# Patient Record
Sex: Female | Born: 1970
Health system: Southern US, Community
[De-identification: ages and names within clinical notes are randomized; demographics above are authoritative.]

## PROBLEM LIST (undated history)

## (undated) DIAGNOSIS — I10 Essential (primary) hypertension: Secondary | ICD-10-CM

## (undated) DIAGNOSIS — K645 Perianal venous thrombosis: Secondary | ICD-10-CM

## (undated) DIAGNOSIS — K219 Gastro-esophageal reflux disease without esophagitis: Secondary | ICD-10-CM

## (undated) DIAGNOSIS — N809 Endometriosis, unspecified: Secondary | ICD-10-CM

## (undated) DIAGNOSIS — K2980 Duodenitis without bleeding: Secondary | ICD-10-CM

## (undated) HISTORY — DX: Endometriosis, unspecified: N80.9

## (undated) HISTORY — DX: Duodenitis without bleeding: K29.80

## (undated) HISTORY — PX: OTHER SURGICAL HISTORY: SHX169

## (undated) HISTORY — DX: Perianal venous thrombosis: K64.5

## (undated) HISTORY — DX: Essential (primary) hypertension: I10

## (undated) HISTORY — PX: ABDOMINAL HYSTERECTOMY: SHX81

---

## 1997-09-04 ENCOUNTER — Inpatient Hospital Stay (HOSPITAL_COMMUNITY): Admission: AD | Admit: 1997-09-04 | Discharge: 1997-09-04 | Payer: Self-pay | Admitting: Obstetrics and Gynecology

## 1997-09-05 ENCOUNTER — Inpatient Hospital Stay (HOSPITAL_COMMUNITY): Admission: AD | Admit: 1997-09-05 | Discharge: 1997-09-05 | Payer: Self-pay | Admitting: Obstetrics and Gynecology

## 1997-10-07 ENCOUNTER — Ambulatory Visit (HOSPITAL_COMMUNITY): Admission: RE | Admit: 1997-10-07 | Discharge: 1997-10-07 | Payer: Self-pay | Admitting: Obstetrics and Gynecology

## 1997-11-01 ENCOUNTER — Inpatient Hospital Stay (HOSPITAL_COMMUNITY): Admission: AD | Admit: 1997-11-01 | Discharge: 1997-11-04 | Payer: Self-pay | Admitting: Obstetrics and Gynecology

## 1997-11-05 ENCOUNTER — Encounter (HOSPITAL_COMMUNITY): Admission: RE | Admit: 1997-11-05 | Discharge: 1997-12-13 | Payer: Self-pay | Admitting: Obstetrics & Gynecology

## 1997-12-09 ENCOUNTER — Other Ambulatory Visit: Admission: RE | Admit: 1997-12-09 | Discharge: 1997-12-09 | Payer: Self-pay | Admitting: Obstetrics & Gynecology

## 1997-12-20 ENCOUNTER — Inpatient Hospital Stay (HOSPITAL_COMMUNITY): Admission: EM | Admit: 1997-12-20 | Discharge: 1997-12-20 | Payer: Self-pay | Admitting: Obstetrics and Gynecology

## 1997-12-20 ENCOUNTER — Ambulatory Visit (HOSPITAL_COMMUNITY): Admission: RE | Admit: 1997-12-20 | Discharge: 1997-12-20 | Payer: Self-pay

## 1997-12-20 ENCOUNTER — Encounter: Payer: Self-pay | Admitting: Anesthesiology

## 1999-04-01 ENCOUNTER — Other Ambulatory Visit: Admission: RE | Admit: 1999-04-01 | Discharge: 1999-04-01 | Payer: Self-pay | Admitting: Obstetrics and Gynecology

## 2000-01-05 DIAGNOSIS — N809 Endometriosis, unspecified: Secondary | ICD-10-CM

## 2000-01-05 HISTORY — PX: OVARY SURGERY: SHX727

## 2000-01-05 HISTORY — DX: Endometriosis, unspecified: N80.9

## 2000-09-12 ENCOUNTER — Other Ambulatory Visit: Admission: RE | Admit: 2000-09-12 | Discharge: 2000-09-12 | Payer: Self-pay | Admitting: Obstetrics and Gynecology

## 2004-05-05 ENCOUNTER — Ambulatory Visit: Payer: Self-pay | Admitting: Obstetrics and Gynecology

## 2005-01-12 ENCOUNTER — Observation Stay: Payer: Self-pay | Admitting: Certified Nurse Midwife

## 2006-12-09 ENCOUNTER — Ambulatory Visit: Payer: Self-pay | Admitting: Family Medicine

## 2007-01-05 DIAGNOSIS — K645 Perianal venous thrombosis: Secondary | ICD-10-CM

## 2007-01-05 HISTORY — PX: CHOLECYSTECTOMY: SHX55

## 2007-01-05 HISTORY — DX: Perianal venous thrombosis: K64.5

## 2007-02-03 ENCOUNTER — Other Ambulatory Visit: Payer: Self-pay

## 2007-02-04 ENCOUNTER — Observation Stay: Payer: Self-pay | Admitting: General Surgery

## 2007-11-19 ENCOUNTER — Ambulatory Visit: Payer: Self-pay | Admitting: Emergency Medicine

## 2008-02-14 ENCOUNTER — Ambulatory Visit: Payer: Self-pay | Admitting: Gastroenterology

## 2008-02-23 ENCOUNTER — Ambulatory Visit: Payer: Self-pay | Admitting: Gastroenterology

## 2009-09-07 ENCOUNTER — Observation Stay: Payer: Self-pay

## 2010-02-26 ENCOUNTER — Ambulatory Visit: Payer: Self-pay

## 2010-08-18 ENCOUNTER — Encounter: Payer: Self-pay | Admitting: Rheumatology

## 2010-09-05 ENCOUNTER — Encounter: Payer: Self-pay | Admitting: Rheumatology

## 2010-10-05 ENCOUNTER — Encounter: Payer: Self-pay | Admitting: Rheumatology

## 2011-03-18 ENCOUNTER — Ambulatory Visit: Payer: Self-pay | Admitting: Family Medicine

## 2011-09-09 ENCOUNTER — Other Ambulatory Visit: Payer: Self-pay | Admitting: General Surgery

## 2011-09-09 LAB — CBC WITH DIFFERENTIAL/PLATELET
Basophil #: 0 10*3/uL (ref 0.0–0.1)
Basophil %: 0.5 %
Eosinophil #: 0.8 10*3/uL — ABNORMAL HIGH (ref 0.0–0.7)
Eosinophil %: 10.3 %
HCT: 41.9 % (ref 35.0–47.0)
HGB: 14.2 g/dL (ref 12.0–16.0)
Lymphocyte #: 1.8 10*3/uL (ref 1.0–3.6)
Lymphocyte %: 22.6 %
MCH: 29.5 pg (ref 26.0–34.0)
MCHC: 33.8 g/dL (ref 32.0–36.0)
MCV: 87 fL (ref 80–100)
Monocyte #: 0.5 x10 3/mm (ref 0.2–0.9)
Monocyte %: 6.2 %
Neutrophil #: 4.9 10*3/uL (ref 1.4–6.5)
Neutrophil %: 60.4 %
Platelet: 336 10*3/uL (ref 150–440)
RBC: 4.82 10*6/uL (ref 3.80–5.20)
RDW: 13.3 % (ref 11.5–14.5)
WBC: 8.1 10*3/uL (ref 3.6–11.0)

## 2011-09-09 LAB — SEDIMENTATION RATE: Erythrocyte Sed Rate: 13 mm/hr (ref 0–20)

## 2011-09-10 ENCOUNTER — Ambulatory Visit: Payer: Self-pay | Admitting: General Surgery

## 2011-11-03 ENCOUNTER — Ambulatory Visit: Payer: Self-pay | Admitting: General Surgery

## 2011-11-05 LAB — PATHOLOGY REPORT

## 2011-12-09 ENCOUNTER — Ambulatory Visit: Payer: Self-pay | Admitting: Obstetrics and Gynecology

## 2011-12-09 LAB — BASIC METABOLIC PANEL
Anion Gap: 6 — ABNORMAL LOW (ref 7–16)
BUN: 12 mg/dL (ref 7–18)
Calcium, Total: 9 mg/dL (ref 8.5–10.1)
Chloride: 108 mmol/L — ABNORMAL HIGH (ref 98–107)
Co2: 27 mmol/L (ref 21–32)
Creatinine: 0.84 mg/dL (ref 0.60–1.30)
EGFR (African American): >60
EGFR (Non-African Amer.): >60
Glucose: 89 mg/dL (ref 65–99)
Osmolality: 280 (ref 275–301)
Potassium: 4.2 mmol/L (ref 3.5–5.1)
Sodium: 141 mmol/L (ref 136–145)

## 2011-12-09 LAB — CBC
HCT: 38.7 % (ref 35.0–47.0)
HGB: 12.6 g/dL (ref 12.0–16.0)
MCH: 28.7 pg (ref 26.0–34.0)
MCHC: 32.7 g/dL (ref 32.0–36.0)
MCV: 88 fL (ref 80–100)
Platelet: 305 10*3/uL (ref 150–440)
RBC: 4.41 10*6/uL (ref 3.80–5.20)
RDW: 12.9 % (ref 11.5–14.5)
WBC: 10.3 10*3/uL (ref 3.6–11.0)

## 2011-12-13 ENCOUNTER — Ambulatory Visit: Payer: Self-pay | Admitting: Obstetrics and Gynecology

## 2011-12-14 LAB — CREATININE, SERUM
Creatinine: 0.82 mg/dL (ref 0.60–1.30)
EGFR (African American): >60
EGFR (Non-African Amer.): >60

## 2011-12-14 LAB — HEMOGLOBIN: HGB: 11.4 g/dL — ABNORMAL LOW (ref 12.0–16.0)

## 2011-12-15 LAB — PATHOLOGY REPORT

## 2012-04-13 ENCOUNTER — Ambulatory Visit: Payer: Self-pay | Admitting: General Practice

## 2012-12-26 ENCOUNTER — Ambulatory Visit: Payer: Self-pay | Admitting: General Practice

## 2014-02-25 ENCOUNTER — Ambulatory Visit: Payer: Self-pay | Admitting: Family Medicine

## 2014-04-08 ENCOUNTER — Encounter: Payer: Self-pay | Admitting: *Deleted

## 2014-04-23 NOTE — Op Note (Signed)
PATIENT NAME:  Leah Burnett, Leah Burnett MR#:  [id number] DATE OF BIRTH:  05/30/1970  DATE OF PROCEDURE:  12/13/2011  PREOPERATIVE DIAGNOSES:  1. Endometriosis.  2. Chronic pelvic pain.  3. Leiomyoma uteri.   POSTOPERATIVE DIAGNOSES:  1. Endometriosis.  2. Chronic pelvic pain.  3. Leiomyoma uteri.   OPERATIVE PROCEDURE: LAVH, LSO.   SURGEON: Leah Burnett A. Leah Gearing, MD   FIRST ASSISTANT: None.   ANESTHESIA: General endotracheal.   INDICATIONS: The patient is a 44 year old multiparous white female who is status post RSO in the past for symptomatic endometriosis. She is now having worsening recurrent pain and desires to have definitive surgery.   FINDINGS AT SURGERY: Findings at surgery revealed a small fibroid uterus. There was a left ovary with hemorrhagic cyst and what appeared to be endometriosis powder burn implants. The cul-de-sac it was notable for pseudofenestrations as well as scarring suspicious for endometriosis effect.   DESCRIPTION OF PROCEDURE: The patient was brought to the operating room where she was placed in the supine position. General endotracheal anesthesia was induced without difficulty. She was placed in the dorsal lithotomy position using the bumblebee stirrups. A ChloraPrep and Betadine abdominoperineal intravaginal prep and drape was performed in the standard fashion. A Foley catheter was placed and was draining clear yellow urine. A Hulka tenaculum was placed onto the cervix to facilitate uterine manipulation. A subumbilical vertical incision 5 mm in length was made. The Optiview Laparoscopic Trocar System was used to place a 5 mm port directly into the abdominal cavity. No bowel or vascular injury was encountered. Two other 5 mm ports were placed in the right and left lower quadrants, respectively. The findings were photo documented. The procedure was then performed in standard fashion. The left round ligament was cauterized and cut using the Harmonic scalpel. The left  infundibulopelvic ligament was clamped, coagulated, and cut using the Harmonic scalpel. Sequentially, the mesosalpinx as well as the cardinal broad ligament complexes were then clamped, coagulated, and cut using the Ace Harmonic scalpel down to the level of the uterosacral ligaments. Here the bladder flap was created with the Harmonic scalpel. The uterine arteries were cauterized. On the right adnexal region, the Harmonic scalpel again was used to clamp, cauterize, and desiccate the cardinal broad ligament complex down to the level of the uterosacral ligament. The uterine artery was skeletonized and then cauterized using Kleppinger bipolar forceps. Once adequate development of the bladder flap was done, the abdominal procedure was terminated and the vaginal hysterectomy portion was then completed. A weighted speculum was placed into the vagina. A double-tooth tenaculum was placed onto the cervix. A posterior colpotomy was made with Mayo scissors. The uterosacral ligaments were clamped, cut, and stick tied using 0 Vicryl. The anterior cervix was circumscribed with the Bovie cautery, and the bladder was dissected off the lower uterine segment through sharp and blunt dissection. The anterior cul-de-sac was eventually entered. The remaining cardinal broad ligament complexes were then clamped, cut, and stick tied using 0 Vicryl. The specimen was then removed from the operative field. The posterior cuff was run using 0 Vicryl in a running baseball stitch manner. A bleeder on the right pelvic sidewall was clamped and stick tied using 2-0 Vicryl. The vagina was then closed with simple interrupted sutures of 2-0 chromic. Upon completion of the vaginal portion of the procedure, a repeat laparoscopy was performed and verified hemostasis. The pelvis was irrigated. The ureters were noted to be functional and having normal anatomic orientation. All irrigant fluid was removed. Pneumoperitoneum  was released, and the incisions were  closed with Dermabond glue. The patient was then awakened, extubated, and taken to the recovery room in satisfactory condition.  Estimated blood loss was 250 mL. IV fluids were 900 mL.  All instruments, needle, and sponge counts were verified as correct. The patient did get 2 grams of Ancef antibiotic prophylaxis.  ____________________________ Prentice DockerMartin A. Analy Bassford, MD mad:cbb D: 12/13/2011 14:50:45 ET T: 12/13/2011 15:29:36 ET JOB#: [id number]  cc: Daphine DeutscherMartin A. Marchele Decock, MD, <Dictator> Prentice DockerMARTIN A Jahaziel Francois MD ELECTRONICALLY SIGNED 12/15/2011 11:59

## 2014-04-30 DIAGNOSIS — F4322 Adjustment disorder with anxiety: Secondary | ICD-10-CM | POA: Insufficient documentation

## 2014-04-30 DIAGNOSIS — I471 Supraventricular tachycardia: Secondary | ICD-10-CM | POA: Insufficient documentation

## 2014-04-30 DIAGNOSIS — F419 Anxiety disorder, unspecified: Secondary | ICD-10-CM | POA: Insufficient documentation

## 2014-04-30 DIAGNOSIS — G4700 Insomnia, unspecified: Secondary | ICD-10-CM | POA: Insufficient documentation

## 2014-04-30 DIAGNOSIS — J309 Allergic rhinitis, unspecified: Secondary | ICD-10-CM | POA: Insufficient documentation

## 2014-04-30 DIAGNOSIS — M797 Fibromyalgia: Secondary | ICD-10-CM | POA: Insufficient documentation

## 2014-04-30 DIAGNOSIS — N951 Menopausal and female climacteric states: Secondary | ICD-10-CM | POA: Insufficient documentation

## 2014-05-03 ENCOUNTER — Other Ambulatory Visit
Admit: 2014-05-03 | Disposition: A | Discharge status: Home / Self Care | Payer: Self-pay | Attending: Gastroenterology | Admitting: Gastroenterology

## 2014-05-03 LAB — CBC WITH DIFFERENTIAL/PLATELET
Basophil #: 0 10*3/uL (ref 0.0–0.1)
Basophil %: 0.5 %
EOS PCT: 0.9 %
Eosinophil #: 0.1 10*3/uL (ref 0.0–0.7)
HCT: 39.1 % (ref 35.0–47.0)
HGB: 12.9 g/dL (ref 12.0–16.0)
Lymphocyte #: 2 10*3/uL (ref 1.0–3.6)
Lymphocyte %: 34 %
MCH: 28.1 pg (ref 26.0–34.0)
MCHC: 32.9 g/dL (ref 32.0–36.0)
MCV: 86 fL (ref 80–100)
MONOS PCT: 7.2 %
Monocyte #: 0.4 x10 3/mm (ref 0.2–0.9)
NEUTROS PCT: 57.4 %
Neutrophil #: 3.3 10*3/uL (ref 1.4–6.5)
PLATELETS: 310 10*3/uL (ref 150–440)
RBC: 4.57 10*6/uL (ref 3.80–5.20)
RDW: 13.8 % (ref 11.5–14.5)
WBC: 5.8 10*3/uL (ref 3.6–11.0)

## 2014-05-03 LAB — LIPASE, BLOOD: LIPASE: 33 U/L

## 2014-06-11 ENCOUNTER — Ambulatory Visit: Payer: Self-pay | Admitting: Family Medicine

## 2014-06-12 ENCOUNTER — Ambulatory Visit: Payer: 59 | Admitting: Family Medicine

## 2014-08-05 ENCOUNTER — Ambulatory Visit (INDEPENDENT_AMBULATORY_CARE_PROVIDER_SITE_OTHER): Payer: 59 | Admitting: Family Medicine

## 2014-08-05 ENCOUNTER — Encounter: Payer: Self-pay | Admitting: Family Medicine

## 2014-08-05 VITALS — BP 122/64 | HR 74 | Temp 98.3°F | Resp 16 | Ht 65.0 in | Wt 184.0 lb

## 2014-08-05 DIAGNOSIS — G4700 Insomnia, unspecified: Secondary | ICD-10-CM | POA: Diagnosis not present

## 2014-08-05 DIAGNOSIS — N951 Menopausal and female climacteric states: Secondary | ICD-10-CM

## 2014-08-05 DIAGNOSIS — R232 Flushing: Secondary | ICD-10-CM

## 2014-08-05 NOTE — Progress Notes (Signed)
Patient ID: Leah Burnett, female   DOB: 07-02-1970, 44 y.o.   MRN: [id number]   Leah Burnett  MRN: [id number] DOB: 1970-06-09  Subjective:  HPI   1. Insomnia Patient is a 44 year old female who presents today for follow up of her insomnia.  He last visit was on 10/10/13.  She currently takes Ambien for her insomnia and states she has to take it every night or she will not get any sleep.  Without the Ambien she does not fall asleep.  She does wake a couple of times a night a couple of nights a week. When this happens she is able to fall back asleep without difficulty.  2. Hot flashes Patient had left ovary removed prior to her hysterectomy and then had the right ovary removed at the time of the hysterectomy.  Patient has never been on any hormone replacement.  She states that in the past when she tried oral birth control it would elevate her blood pressure.  She has never had to be treated for the elevated BP with any medication.  She is afraid to try hormone replacement therapy because of the hypertension history.  Patient is having hot flashes mostly during the day.  She states that she has them a lot during the day.  She feels she has become intolerant to any kind of heat.  She states that the only other symptoms she would relate to her hormones is nausea.  No palpitations, irritability or being overly emotional.   Patient Active Problem List   Diagnosis Date Noted  . Adjustment disorder with anxiety 04/30/2014  . Allergic rhinitis 04/30/2014  . Anxiety 04/30/2014  . Fibrositis 04/30/2014  . Hot flash, menopausal 04/30/2014  . Cannot sleep 04/30/2014  . SVT (supraventricular tachycardia) 04/30/2014    Past Medical History  Diagnosis Date  . Hypertension   . External thrombosed hemorrhoids 2009  . Endometriosis 2002  . Duodenitis     History   Social History  . Marital Status: Married    Spouse Name: N/A  . Number of Children: N/A  . Years of Education: N/A    Occupational History  . Not on file.   Social History Main Topics  . Smoking status: Never Smoker   . Smokeless tobacco: Never Used  . Alcohol Use: 0.0 oz/week    0 Standard drinks or equivalent per week     Comment: very rarely  . Drug Use: No  . Sexual Activity: Not on file   Other Topics Concern  . Not on file   Social History Narrative    Outpatient Prescriptions Prior to Visit  Medication Sig Dispense Refill  . Cinnamon 500 MG TABS Take 1 tablet by mouth daily.    . fluticasone (FLONASE) 50 MCG/ACT nasal spray Place 2 sprays into the nose daily.    Marland Kitchen KRILL OIL PO Take 2 tablets by mouth daily.    Marland Kitchen zolpidem (AMBIEN) 10 MG tablet Take 1 tablet by mouth at bedtime as needed.    . Melatonin 5 MG TABS Take 2 tablets by mouth daily.    . Probiotic CAPS Take 1 tablet by mouth daily.    . traZODone (DESYREL) 100 MG tablet Take 1 tablet by mouth daily.     No facility-administered medications prior to visit.    Not on File  Review of Systems  Constitutional: Negative for fever, chills, weight loss, malaise/fatigue and diaphoresis.  Respiratory: Negative for cough, hemoptysis, sputum production, shortness of breath  and wheezing.   Cardiovascular: Negative for chest pain, palpitations, orthopnea, claudication, leg swelling and PND.  Neurological: Negative for dizziness, weakness and headaches.  Psychiatric/Behavioral: Negative for depression, suicidal ideas, hallucinations and memory loss. The patient is not nervous/anxious and does not have insomnia (Not while on medications).    Objective:  BP 122/64 mmHg  Pulse 74  Temp(Src) 98.3 F (36.8 C) (Oral)  Resp 16  Ht  (1.651 m)  Wt 184 lb (83.462 kg)  BMI 30.62 kg/m2  Physical Exam  Constitutional: She is well-developed, well-nourished, and in no distress.  Neck: Normal range of motion. Neck supple.  Cardiovascular: Normal rate, regular rhythm and normal heart sounds.   Pulmonary/Chest: Effort normal and breath  sounds normal.  Skin: Skin is warm and dry.  Psychiatric: Mood, memory, affect and judgment normal.    Assessment and Plan :   1. Insomnia Stable.  Continue current medications.  2. Hot flashes Have offered to the patient that she can try Venlafaxine for the hot flashes or that she can see Tomasa Hose at [facility] for hormone replacement.  Patient would like to have a consult with Annice Pih.  3 overweight Patient is very concerned about this. Discussed diet and exercise habits at length. She is not obese. If she can improve her habits or weight will come down to a level that pleases her. Her weight distribution is normal. I will check her waist size on the next visit. Julieanne Manson MD [facility] 08/05/2014 2:01 PM

## 2014-08-07 ENCOUNTER — Encounter: Payer: Self-pay | Admitting: Family Medicine

## 2014-10-04 ENCOUNTER — Other Ambulatory Visit: Payer: Self-pay | Admitting: Family Medicine

## 2014-10-04 NOTE — Telephone Encounter (Signed)
Dr. Reece Agar This will need to be approved by you.  Thanks  ED

## 2014-10-30 ENCOUNTER — Ambulatory Visit: Payer: Self-pay | Admitting: Physician Assistant

## 2014-10-30 ENCOUNTER — Encounter: Payer: Self-pay | Admitting: Physician Assistant

## 2014-10-30 VITALS — BP 110/80 | HR 80 | Temp 98.8°F

## 2014-10-30 DIAGNOSIS — M5440 Lumbago with sciatica, unspecified side: Secondary | ICD-10-CM

## 2014-10-30 MED ORDER — METHYLPREDNISOLONE 4 MG PO TBPK: ORAL_TABLET | ORAL | Status: DC

## 2014-10-30 MED ORDER — CYCLOBENZAPRINE HCL 10 MG PO TABS: 10.0000 mg | ORAL_TABLET | Freq: Three times a day (TID) | ORAL | Status: DC | PRN

## 2014-10-30 NOTE — Progress Notes (Signed)
S:  C/o low back pain for 3 weeks, used otc ibuprofen without relief, no known injury, pain is worse with movement, increased with bending over, increased with sitting, relieved by walking around, pain radiates from left side of lower back around to front of left hip,  denies numbness, tingling, or changes in bowel/urinary habits,  Using otc meds without relief Remainder ros neg  O:  Vitals wnl, nad, lungs c t a, cv rrr, spine nontender,  , decreased rom with standing and bending forward,  Neg slr, pt walks without difficulty but walks slowly;  no foot drop noted, n/v intact  A: acute back pain, muscle spasms  P: medrol dose pack, flexeril 10mg  tid #30 nruse wet heat followed by ice, stretches, return to clinic if not better in 3 t 5 days, return earlier if worsening, rx meds:

## 2014-12-09 ENCOUNTER — Other Ambulatory Visit: Payer: Self-pay | Admitting: Physical Medicine and Rehabilitation

## 2014-12-09 DIAGNOSIS — M5136 Other intervertebral disc degeneration, lumbar region: Secondary | ICD-10-CM

## 2014-12-09 DIAGNOSIS — M5416 Radiculopathy, lumbar region: Secondary | ICD-10-CM

## 2015-01-07 DIAGNOSIS — M5116 Intervertebral disc disorders with radiculopathy, lumbar region: Secondary | ICD-10-CM | POA: Diagnosis not present

## 2015-01-07 DIAGNOSIS — M4727 Other spondylosis with radiculopathy, lumbosacral region: Secondary | ICD-10-CM | POA: Diagnosis not present

## 2015-01-28 DIAGNOSIS — M5416 Radiculopathy, lumbar region: Secondary | ICD-10-CM | POA: Insufficient documentation

## 2015-01-28 DIAGNOSIS — M5136 Other intervertebral disc degeneration, lumbar region: Secondary | ICD-10-CM | POA: Insufficient documentation

## 2015-01-28 DIAGNOSIS — M5116 Intervertebral disc disorders with radiculopathy, lumbar region: Secondary | ICD-10-CM | POA: Insufficient documentation

## 2015-01-28 DIAGNOSIS — M5412 Radiculopathy, cervical region: Secondary | ICD-10-CM | POA: Diagnosis not present

## 2015-01-28 DIAGNOSIS — M62830 Muscle spasm of back: Secondary | ICD-10-CM | POA: Diagnosis not present

## 2015-02-05 ENCOUNTER — Ambulatory Visit: Payer: 59 | Admitting: Family Medicine

## 2015-02-18 ENCOUNTER — Ambulatory Visit (INDEPENDENT_AMBULATORY_CARE_PROVIDER_SITE_OTHER): Payer: 59 | Admitting: Family Medicine

## 2015-02-18 ENCOUNTER — Encounter: Payer: Self-pay | Admitting: Family Medicine

## 2015-02-18 VITALS — BP 128/84 | HR 100 | Temp 98.1°F | Resp 12 | Wt 183.0 lb

## 2015-02-18 DIAGNOSIS — N951 Menopausal and female climacteric states: Secondary | ICD-10-CM

## 2015-02-18 DIAGNOSIS — G4700 Insomnia, unspecified: Secondary | ICD-10-CM

## 2015-02-18 DIAGNOSIS — E663 Overweight: Secondary | ICD-10-CM

## 2015-02-18 DIAGNOSIS — R232 Flushing: Secondary | ICD-10-CM

## 2015-02-18 MED ORDER — PHENTERMINE HCL 37.5 MG PO CAPS: 37.5000 mg | ORAL_CAPSULE | ORAL | Status: DC

## 2015-02-18 NOTE — Progress Notes (Signed)
Patient ID: Leah Burnett, female   DOB: 1970/01/16, 45 y.o.   MRN: [id number]    Subjective:  HPI  Patient is here for 6 months follow up.  Insomnia: Patient is doing well with this on medication-takes Zolpidem every night.  Hot flashes: Patient was offered Venlafaxine to see if it would help, patient wanted to wait and speak with Annice Pih at [facility] but she did not get a chance to do that. She states as of now hot flashes bother her only during the day.  Overweight: On the last visit discussed working on habits. Patient did go to a dietician class for about 6 weeks they offered at the hospital and she was doing good but once class stoped motivation decreased. Per our scale she lost 1 lb.  Prior to Admission medications   Medication Sig Start Date End Date Taking? Authorizing Provider  cyclobenzaprine (FLEXERIL) 10 MG tablet Take 1 tablet (10 mg total) by mouth 3 (three) times daily as needed for muscle spasms. 10/30/14  Yes Faythe Ghee, PA-C  etodolac (LODINE) 500 MG tablet  09/12/14  Yes Historical Provider, MD  fluticasone (FLONASE) 50 MCG/ACT nasal spray Place 2 sprays into the nose daily. 12/24/10  Yes Historical Provider, MD  KRILL OIL PO Take 2 tablets by mouth daily.   Yes Historical Provider, MD  zolpidem (AMBIEN) 10 MG tablet TAKE ONE TABLET BY MOUTH AT BEDTIME AS NEEDED 10/04/14  Yes Maple Hudson., MD    Patient Active Problem List   Diagnosis Date Noted  . Degeneration of intervertebral disc of lumbar region 01/28/2015  . Neuritis or radiculitis due to rupture of lumbar intervertebral disc 01/28/2015  . Adjustment disorder with anxiety 04/30/2014  . Allergic rhinitis 04/30/2014  . Anxiety 04/30/2014  . Fibrositis 04/30/2014  . Hot flash, menopausal 04/30/2014  . Cannot sleep 04/30/2014  . SVT (supraventricular tachycardia) (HCC) 04/30/2014    Past Medical History  Diagnosis Date  . Hypertension   . External thrombosed hemorrhoids 2009  .  Endometriosis 2002  . Duodenitis     Social History   Social History  . Marital Status: Married    Spouse Name: N/A  . Number of Children: N/A  . Years of Education: N/A   Occupational History  . Not on file.   Social History Main Topics  . Smoking status: Never Smoker   . Smokeless tobacco: Never Used  . Alcohol Use: 0.0 oz/week    0 Standard drinks or equivalent per week     Comment: very rarely  . Drug Use: No  . Sexual Activity: Not on file   Other Topics Concern  . Not on file   Social History Narrative    No Known Allergies  Review of Systems  Constitutional: Negative.   Respiratory: Negative.   Cardiovascular: Negative.   Gastrointestinal: Negative.   Musculoskeletal: Negative.   Psychiatric/Behavioral: Negative.     Immunization History  Administered Date(s) Administered  . Influenza-Unspecified 10/05/2014   Objective:  BP 128/84 mmHg  Pulse 100  Temp(Src) 98.1 F (36.7 C)  Resp 12  Wt 183 lb (83.008 kg)  Physical Exam  Constitutional: She is oriented to person, place, and time and well-developed, well-nourished, and in no distress.  HENT:  Head: Normocephalic and atraumatic.  Right Ear: External ear normal.  Left Ear: External ear normal.  Nose: Nose normal.  Eyes: Conjunctivae are normal. Pupils are equal, round, and reactive to light.  Neck: Normal range of motion. Neck supple.  Cardiovascular: Normal rate, regular rhythm, normal heart sounds and intact distal pulses.   No murmur heard. Pulmonary/Chest: Effort normal and breath sounds normal. No respiratory distress. She has no wheezes.  Neurological: She is alert and oriented to person, place, and time.  Skin: Skin is warm and dry.  Psychiatric: Mood, memory, affect and judgment normal.    Lab Results  Component Value Date   WBC 5.8 05/03/2014   HGB 12.9 05/03/2014   HCT 39.1 05/03/2014   PLT 310 05/03/2014   GLUCOSE 89 12/09/2011    CMP     Component Value Date/Time   NA  141 12/09/2011 1112   K 4.2 12/09/2011 1112   CL 108* 12/09/2011 1112   CO2 27 12/09/2011 1112   GLUCOSE 89 12/09/2011 1112   BUN 12 12/09/2011 1112   CREATININE 0.82 12/14/2011 0541   CALCIUM 9.0 12/09/2011 1112   GFRNONAA >60 12/14/2011 0541   GFRAA >60 12/14/2011 0541    Assessment and Plan :  1. Insomnia Stable. Patient states she needs this every night otherwise she is not able to sleep. Stressed to her that I would love for her to cut back on using this medication nightly. 2. Hot flashes Stable. During the day for now. Discussed trying something for hormones but patient is afraid to do this at this time. Will follow for now.  3. Overweight Discussed working on habits. Discussed trying Phentermine per patient request and that it is for short period of time to help patient cut back on portions. Discussed possible risks of taking this medication. Will follow up on the next visit.  Patient was seen and examined by Dr. Bosie Clos and note was scribed by Samara Deist, RMA.    Julieanne Manson MD [facility] 02/18/2015 2:32 PM

## 2015-04-07 ENCOUNTER — Other Ambulatory Visit: Payer: Self-pay | Admitting: Family Medicine

## 2015-06-11 DIAGNOSIS — H524 Presbyopia: Secondary | ICD-10-CM | POA: Diagnosis not present

## 2015-06-18 ENCOUNTER — Encounter: Payer: Self-pay | Admitting: Family Medicine

## 2015-06-18 ENCOUNTER — Ambulatory Visit (INDEPENDENT_AMBULATORY_CARE_PROVIDER_SITE_OTHER): Payer: 59 | Admitting: Family Medicine

## 2015-06-18 VITALS — BP 122/68 | Temp 97.9°F | Wt 170.0 lb

## 2015-06-18 DIAGNOSIS — N951 Menopausal and female climacteric states: Secondary | ICD-10-CM | POA: Diagnosis not present

## 2015-06-18 DIAGNOSIS — E663 Overweight: Secondary | ICD-10-CM | POA: Diagnosis not present

## 2015-06-18 DIAGNOSIS — G4700 Insomnia, unspecified: Secondary | ICD-10-CM

## 2015-06-18 MED ORDER — PHENTERMINE HCL 37.5 MG PO CAPS: 37.5000 mg | ORAL_CAPSULE | ORAL | Status: DC

## 2015-06-18 NOTE — Progress Notes (Signed)
Patient ID: Lynn ItoLeslie S Bateson, female   DOB: 1970-04-07, 45 y.o.   MRN: [id number]        Patient: Lynn ItoLeslie S Chausse Female    DOB: 1970-04-07   45 y.o.   MRN: [id number] Visit Date: 06/18/2015  Today's Provider: Megan Mansichard Kagan Mutchler Jr, MD   Chief Complaint  Patient presents with  . Insomnia    4 month follow up   . Obesity   Subjective:    HPI Patient comes in today for a 4 month follow up. She reports that she has been sleeping fairly well with the use of Ambien. Patient denies any other symptoms.  Patient was prescribed Phentermine 37.5mg  on last OV and since then she has lost 13lbs. Patient reports that she has tolerated medication well.     No Known Allergies Current Meds  Medication Sig  . cyclobenzaprine (FLEXERIL) 10 MG tablet Take 1 tablet (10 mg total) by mouth 3 (three) times daily as needed for muscle spasms.  . fluticasone (FLONASE) 50 MCG/ACT nasal spray Place 2 sprays into the nose daily.  Marland Kitchen. KRILL OIL PO Take 2 tablets by mouth daily.  . phentermine 37.5 MG capsule Take 1 capsule (37.5 mg total) by mouth every morning.  . zolpidem (AMBIEN) 10 MG tablet TAKE ONE TABLET BY MOUTH AT BEDTIME AS NEEDED  . [DISCONTINUED] phentermine 37.5 MG capsule Take 1 capsule (37.5 mg total) by mouth every morning.    Review of Systems  Constitutional: Negative.   Respiratory: Negative.   Cardiovascular: Negative.   Psychiatric/Behavioral: Negative.     Social History  Substance Use Topics  . Smoking status: Never Smoker   . Smokeless tobacco: Never Used  . Alcohol Use: 0.0 oz/week    0 Standard drinks or equivalent per week     Comment: very rarely   Objective:   BP 122/68 mmHg  Temp(Src) 97.9 F (36.6 C)  Wt 170 lb (77.111 kg)  Physical Exam  Constitutional: She is oriented to person, place, and time. She appears well-developed and well-nourished.  HENT:  Head: Normocephalic and atraumatic.  Right Ear: External ear normal.  Left Ear: External ear normal.  Nose:  Nose normal.  Eyes: Conjunctivae are normal.  Neck: Normal range of motion. Neck supple.  Cardiovascular: Normal rate, regular rhythm and normal heart sounds.   Pulmonary/Chest: Effort normal and breath sounds normal.  Abdominal: Soft.  Neurological: She is alert and oriented to person, place, and time.  Skin: Skin is warm and dry.  Psychiatric: She has a normal mood and affect. Her behavior is normal. Judgment and thought content normal.        Assessment & Plan:     1. Hot flash, menopausal Patient reports that this has improved since she has decreased her sugar intake. Will continue to focus on healthy eating habits to see if this continues.  2. Insomnia Stable on Ambien 10mg  at bedtime. Discussed the long term use of Ambien and the effects it could cause over time. Recommended that she try to take medication sparingly.   3. Overweight Patient has lost 13lbs since LOV. Printed Rx for phentermine 37.5mg  daily #30 RFx1.Take every other day for 2 months then stop. Continue diet and exercise and will F/U in 6 months.       I have done the exam and reviewed the above chart and it is accurate to the best of my knowledge.  Donni Oglesby Wendelyn BreslowGilbert Jr, MD  [facility]

## 2015-09-04 DIAGNOSIS — M722 Plantar fascial fibromatosis: Secondary | ICD-10-CM | POA: Diagnosis not present

## 2015-10-06 ENCOUNTER — Other Ambulatory Visit: Payer: Self-pay | Admitting: Physician Assistant

## 2015-10-06 NOTE — Telephone Encounter (Signed)
Refill approved.

## 2015-10-15 ENCOUNTER — Encounter: Payer: Self-pay | Admitting: Physician Assistant

## 2015-10-15 ENCOUNTER — Ambulatory Visit: Payer: Self-pay | Admitting: Physician Assistant

## 2015-10-15 VITALS — BP 140/88 | HR 82 | Temp 98.6°F

## 2015-10-15 DIAGNOSIS — J0100 Acute maxillary sinusitis, unspecified: Secondary | ICD-10-CM

## 2015-10-15 MED ORDER — MOMETASONE FUROATE 50 MCG/ACT NA SUSP: 2.0000 | Freq: Every day | NASAL | 12 refills | Status: DC

## 2015-10-15 MED ORDER — AZITHROMYCIN 250 MG PO TABS: ORAL_TABLET | ORAL | 0 refills | Status: DC

## 2015-10-15 MED ORDER — PREDNISONE 10 MG PO TABS: 30.0000 mg | ORAL_TABLET | Freq: Every day | ORAL | 0 refills | Status: DC

## 2015-10-15 MED ORDER — TRIAMCINOLONE ACETONIDE 55 MCG/ACT NA AERO: 2.0000 | INHALATION_SPRAY | Freq: Every day | NASAL | 12 refills | Status: DC

## 2015-10-15 NOTE — Progress Notes (Signed)
S: C/o runny nose and congestion for 5 days, sx on and off for over 2 weeks, using flonase which usually helps but is not helping now, no fever, chills, cp/sob, v/d; mucus is green and thick, cough is sporadic, c/o of facial and dental pain.   Using otc meds:   O: PE: vitals wnl, nad, perrl eomi, normocephalic, tms dull, nasal mucosa red and swollen, throat injected, neck supple no lymph, lungs c t a, cv rrr, neuro intact  A:  Acute sinusitis   P: drink fluids, continue regular meds , use otc meds of choice, return if not improving in 5 days, return earlier if worsening , zpack, nasonex instead of nasacort (talked with pharmacist to change to nasonex), pred 30mg  qd x 3d

## 2015-10-18 DIAGNOSIS — J029 Acute pharyngitis, unspecified: Secondary | ICD-10-CM | POA: Diagnosis not present

## 2015-10-18 DIAGNOSIS — B370 Candidal stomatitis: Secondary | ICD-10-CM | POA: Diagnosis not present

## 2015-10-20 ENCOUNTER — Encounter: Payer: Self-pay | Admitting: Physician Assistant

## 2015-10-20 ENCOUNTER — Ambulatory Visit (INDEPENDENT_AMBULATORY_CARE_PROVIDER_SITE_OTHER): Payer: 59 | Admitting: Physician Assistant

## 2015-10-20 VITALS — BP 108/70 | HR 88 | Temp 98.9°F | Resp 16 | Wt 167.0 lb

## 2015-10-20 DIAGNOSIS — B370 Candidal stomatitis: Secondary | ICD-10-CM | POA: Diagnosis not present

## 2015-10-20 DIAGNOSIS — J0140 Acute pansinusitis, unspecified: Secondary | ICD-10-CM

## 2015-10-20 MED ORDER — MAGIC MOUTHWASH W/LIDOCAINE: 5.0000 mL | Freq: Three times a day (TID) | ORAL | 0 refills | Status: AC | PRN

## 2015-10-20 MED ORDER — MAGIC MOUTHWASH W/LIDOCAINE
5.0000 mL | Freq: Three times a day (TID) | ORAL | 0 refills | Status: DC | PRN
Start: 2015-10-20 — End: 2015-10-20

## 2015-10-20 MED ORDER — AMOXICILLIN-POT CLAVULANATE 875-125 MG PO TABS: 1.0000 | ORAL_TABLET | Freq: Two times a day (BID) | ORAL | 0 refills | Status: DC

## 2015-10-20 NOTE — Progress Notes (Signed)
Patient: Leah Burnett Female    DOB: 1970-03-14   45 y.o.   MRN: [id number] Visit Date: 10/20/2015  Today's Provider: Trey Sailors, PA-C   Chief Complaint  Patient presents with  . Sinusitis   Subjective:    HPI Patient reports that she has had symptoms of sinus infection for about 2 weeks. Patient reports that she was seen through employee clinic about 5 days ago. She was prescribed Zpak and prednisone taper which she has completed. [facility] ([facility]), and reports that she has thrush. And is taking nystatin. Patient reports continuation of symptoms, sore throat, swollen neck, and facial pain. No fever, trouble breathing, choking when swallowing.     No Known Allergies   Current Outpatient Prescriptions:  .  etodolac (LODINE) 500 MG tablet, Reported on 06/18/2015, Disp: , Rfl: 1 .  KRILL OIL PO, Take 2 tablets by mouth daily., Disp: , Rfl:  .  phentermine 37.5 MG capsule, Take 1 capsule (37.5 mg total) by mouth every morning., Disp: 30 capsule, Rfl: 1 .  zolpidem (AMBIEN) 10 MG tablet, TAKE ONE TABLET BY MOUTH AT BEDTIME AS NEEDED, Disp: 30 tablet, Rfl: 5 .  amoxicillin-clavulanate (AUGMENTIN) 875-125 MG tablet, Take 1 tablet by mouth 2 (two) times daily., Disp: 20 tablet, Rfl: 0 .  cyclobenzaprine (FLEXERIL) 10 MG tablet, Take 1 tablet (10 mg total) by mouth 3 (three) times daily as needed for muscle spasms. (Patient not taking: Reported on 10/20/2015), Disp: 30 tablet, Rfl: 0 .  magic mouthwash w/lidocaine SOLN, Take 5 mLs by mouth 3 (three) times daily as needed for mouth pain., Disp: 105 mL, Rfl: 0 .  mometasone (NASONEX) 50 MCG/ACT nasal spray, Place 2 sprays into the nose daily., Disp: 17 g, Rfl: 12  Review of Systems  Constitutional: Positive for activity change, chills, fatigue and fever.  HENT: Positive for congestion, postnasal drip, rhinorrhea, sinus pressure, sneezing, sore throat, trouble swallowing and voice  change.   Eyes: Negative.   Respiratory: Positive for cough.   Musculoskeletal: Positive for myalgias.  Neurological: Positive for headaches.    Social History  Substance Use Topics  . Smoking status: Never Smoker  . Smokeless tobacco: Never Used  . Alcohol use 0.0 oz/week     Comment: very rarely   Objective:   BP 108/70 (BP Location: Left Arm, Patient Position: Sitting, Cuff Size: Normal)   Pulse 88   Temp 98.9 F (37.2 C)   Resp 16   Wt 167 lb (75.8 kg)   SpO2 95%   BMI 27.79 kg/m   Physical Exam  Constitutional: She is oriented to person, place, and time. She appears well-developed and well-nourished. No distress.  HENT:  Right Ear: External ear normal.  Left Ear: External ear normal.  Nose: Right sinus exhibits maxillary sinus tenderness and frontal sinus tenderness. Left sinus exhibits maxillary sinus tenderness and frontal sinus tenderness.  Mouth/Throat: Uvula is midline. Posterior oropharyngeal edema and posterior oropharyngeal erythema present.    Eyes: Conjunctivae are normal.  Neck: Normal range of motion. Neck supple.  Cardiovascular: Normal rate and regular rhythm.   Pulmonary/Chest: Effort normal and breath sounds normal.  Lymphadenopathy:    She has cervical adenopathy.  Neurological: She is alert and oriented to person, place, and time.  Skin: Skin is warm and dry. She is not diaphoretic.  Psychiatric: She has a normal mood and affect. Her behavior is normal.  Assessment & Plan:      Problem List Items Addressed This Visit    None    Visit Diagnoses    Acute pansinusitis, recurrence not specified    -  Primary   Relevant Medications   amoxicillin-clavulanate (AUGMENTIN) 875-125 MG tablet   magic mouthwash w/lidocaine SOLN   Thrush       Relevant Medications   magic mouthwash w/lidocaine SOLN     Patient is 45 y/o otherwise healthy female preventing with continued sinusitis, after a course of azithromycin and 5 day prednisone  treatment. Will treat for sinusitis as above. No allergies to PCN. Advised patient to stop nystatin mouthwash and start Duke's three times daily, swish and spit. Patient to call back if not improved.   Return if symptoms worsen or fail to improve, for sinusitis.  The entirety of the information documented in the History of Present Illness, Review of Systems and Physical Exam were personally obtained by me. Portions of this information were initially documented by [facility] and reviewed by me for thoroughness and accuracy.    Patient Instructions  Sinusitis, Adult Sinusitis is redness, soreness, and inflammation of the paranasal sinuses. Paranasal sinuses are air pockets within the bones of your face. They are located beneath your eyes, in the middle of your forehead, and above your eyes. In healthy paranasal sinuses, mucus is able to drain out, and air is able to circulate through them by way of your nose. However, when your paranasal sinuses are inflamed, mucus and air can become trapped. This can allow bacteria and other germs to grow and cause infection. Sinusitis can develop quickly and last only a short time (acute) or continue over a long period (chronic). Sinusitis that lasts for more than 12 weeks is considered chronic. CAUSES Causes of sinusitis include:  Allergies.  Structural abnormalities, such as displacement of the cartilage that separates your nostrils (deviated septum), which can decrease the air flow through your nose and sinuses and affect sinus drainage.  Functional abnormalities, such as when the small hairs (cilia) that line your sinuses and help remove mucus do not work properly or are not present. SIGNS AND SYMPTOMS Symptoms of acute and chronic sinusitis are the same. The primary symptoms are pain and pressure around the affected sinuses. Other symptoms include:  Upper toothache.  Earache.  Headache.  Bad breath.  Decreased sense of smell and taste.  A cough,  which worsens when you are lying flat.  Fatigue.  Fever.  Thick drainage from your nose, which often is green and may contain pus (purulent).  Swelling and warmth over the affected sinuses. DIAGNOSIS Your health care provider will perform a physical exam. During your exam, your health care provider may perform any of the following to help determine if you have acute sinusitis or chronic sinusitis:  Look in your nose for signs of abnormal growths in your nostrils (nasal polyps).  Tap over the affected sinus to check for signs of infection.  View the inside of your sinuses using an imaging device that has a light attached (endoscope). If your health care provider suspects that you have chronic sinusitis, one or more of the following tests may be recommended:  Allergy tests.  Nasal culture. A sample of mucus is taken from your nose, sent to a lab, and screened for bacteria.  Nasal cytology. A sample of mucus is taken from your nose and examined by your health care provider to determine if your sinusitis is related to an allergy. TREATMENT  Most cases of acute sinusitis are related to a viral infection and will resolve on their own within 10 days. Sometimes, medicines are prescribed to help relieve symptoms of both acute and chronic sinusitis. These may include pain medicines, decongestants, nasal steroid sprays, or saline sprays. However, for sinusitis related to a bacterial infection, your health care provider will prescribe antibiotic medicines. These are medicines that will help kill the bacteria causing the infection. Rarely, sinusitis is caused by a fungal infection. In these cases, your health care provider will prescribe antifungal medicine. For some cases of chronic sinusitis, surgery is needed. Generally, these are cases in which sinusitis recurs more than 3 times per year, despite other treatments. HOME CARE INSTRUCTIONS  Drink plenty of water. Water helps thin the mucus so your  sinuses can drain more easily.  Use a humidifier.  Inhale steam 3-4 times a day (for example, sit in the bathroom with the shower running).  Apply a warm, moist washcloth to your face 3-4 times a day, or as directed by your health care provider.  Use saline nasal sprays to help moisten and clean your sinuses.  Take medicines only as directed by your health care provider.  If you were prescribed either an antibiotic or antifungal medicine, finish it all even if you start to feel better. SEEK IMMEDIATE MEDICAL CARE IF:  You have increasing pain or severe headaches.  You have nausea, vomiting, or drowsiness.  You have swelling around your face.  You have vision problems.  You have a stiff neck.  You have difficulty breathing.   This information is not intended to replace advice given to you by your health care provider. Make sure you discuss any questions you have with your health care provider.   Document Released: 12/21/2004 Document Revised: 01/11/2014 Document Reviewed: 01/05/2011 Elsevier Interactive Patient Education 2016 [employer].              Trey Sailors, PA-C  [facility]

## 2015-10-20 NOTE — Patient Instructions (Signed)

## 2015-10-27 ENCOUNTER — Other Ambulatory Visit: Payer: Self-pay | Admitting: Family Medicine

## 2015-10-27 NOTE — Telephone Encounter (Signed)
LOV 06/18/2015. FU 12/18/2015. Last refill 04/07/2015. Allene DillonEmily Drozdowski, CMA

## 2015-10-28 ENCOUNTER — Other Ambulatory Visit: Payer: Self-pay | Admitting: Physician Assistant

## 2015-10-28 ENCOUNTER — Telehealth: Payer: Self-pay | Admitting: Family Medicine

## 2015-10-28 DIAGNOSIS — B373 Candidiasis of vulva and vagina: Secondary | ICD-10-CM

## 2015-10-28 DIAGNOSIS — B3731 Acute candidiasis of vulva and vagina: Secondary | ICD-10-CM

## 2015-10-28 MED ORDER — FLUCONAZOLE 150 MG PO TABS: 150.0000 mg | ORAL_TABLET | Freq: Once | ORAL | 0 refills | Status: AC

## 2015-10-28 NOTE — Telephone Encounter (Signed)
Pt advised.   Thanks,   -Quanika Solem  

## 2015-10-28 NOTE — Progress Notes (Signed)
Diflucan sent for yeast infection to [facility]. 150 mg orally one time dose. Thank you.

## 2015-10-28 NOTE — Telephone Encounter (Signed)
Sent patient's diflucan to [facility]. Please advise patient, thank you.

## 2015-10-28 NOTE — Telephone Encounter (Signed)
Pt called saying that she needs diflucan.  She is having yeast infection from the antibiotics.  She uses [facility]  Her call back is  [phone]  Thanks Barth Kirkseri

## 2015-10-31 ENCOUNTER — Telehealth: Payer: Self-pay | Admitting: Family Medicine

## 2015-10-31 MED ORDER — FLUCONAZOLE 150 MG PO TABS: 150.0000 mg | ORAL_TABLET | Freq: Once | ORAL | 0 refills | Status: AC

## 2015-10-31 NOTE — Telephone Encounter (Signed)
Please advise 

## 2015-10-31 NOTE — Telephone Encounter (Signed)
Can't really tell if this is just persistent yeast infection or if she is getting a UTI. The augmentin she is on usually cures UTIs. OK to call in a second diflucan 150 and she should also use OTC anti-yeast vaginal cream such as Monistat. But if she is not improving she should come in for u/a. Maurine MinisterDennis is here tomorrow.

## 2015-10-31 NOTE — Telephone Encounter (Addendum)
No answer will try again later. Did send in the rx for [facility]conazole to [facility].

## 2015-10-31 NOTE — Telephone Encounter (Signed)
Pt returned call and was advised Rx for Fluconazole was sent to pharmacy. Thanks TNP

## 2015-10-31 NOTE — Telephone Encounter (Signed)
Patient was notified.

## 2015-10-31 NOTE — Telephone Encounter (Signed)
Pt states she rec'd a Rx to help with a yeast infection and it did help some.  Pt states as of this morning she is having burning when voiding and some itching.  Pt is asking for another Rx to help with this.  [facility].  CB#[phone]/MW

## 2015-11-18 ENCOUNTER — Ambulatory Visit (INDEPENDENT_AMBULATORY_CARE_PROVIDER_SITE_OTHER): Payer: 59 | Admitting: Physician Assistant

## 2015-11-18 ENCOUNTER — Encounter: Payer: Self-pay | Admitting: Physician Assistant

## 2015-11-18 VITALS — BP 124/78 | HR 88 | Temp 98.5°F | Resp 16 | Wt 170.0 lb

## 2015-11-18 DIAGNOSIS — J329 Chronic sinusitis, unspecified: Secondary | ICD-10-CM

## 2015-11-18 NOTE — Patient Instructions (Signed)

## 2015-11-18 NOTE — Progress Notes (Signed)
Patient: Leah Burnett Female    DOB: 09/20/1970   45 y.o.   MRN: [id number] Visit Date: 11/18/2015  Today's Provider: Trey Sailors, PA-C   Chief Complaint  Patient presents with  . Sinusitis   Subjective:    Sinusitis  This is a chronic problem. The current episode started more than 1 month ago. The problem has been gradually worsening (Pt reports she never got completely better from last month.  Then started worsening again four days ago.) since onset. There has been no fever. Associated symptoms include congestion, ear pain (Also very itchy), headaches, a hoarse voice, sinus pressure and sneezing. Pertinent negatives include no chills, coughing, diaphoresis, shortness of breath or sore throat. Past treatments include spray decongestants (She has also try her daughter's Xyzal.). The treatment provided no relief.   Patient is a 45 y/o female who was seen last month in the office for sinusitis. [facility] and provided azithromycin. She was seen in urgent care later for thrush and given magic mouthwash and prednisone. She presented to our clinic with worsening of symptoms and prescribed with Augmentin. She did get a vaginal yeast infection with this and was provided with diflucan. Augmentin helped some with symptoms but did not offer complete resolution. She started feeling worse four days ago. No fever, chills, myalgia. Her daughter was recently tested by an allergist and came back with many allergies, and patient is wondering if she may need allergy referral as well.  No Known Allergies   Current Outpatient Prescriptions:  .  KRILL OIL PO, Take 2 tablets by mouth daily., Disp: , Rfl:  .  mometasone (NASONEX) 50 MCG/ACT nasal spray, Place 2 sprays into the nose daily., Disp: 17 g, Rfl: 12 .  zolpidem (AMBIEN) 10 MG tablet, TAKE 1 TABLET BY MOUTH AT BEDTIME AS NEEDED, Disp: 30 tablet, Rfl: 5  Review of Systems  Constitutional: Positive for  fatigue. Negative for activity change, appetite change, chills, diaphoresis, fever and unexpected weight change.  HENT: Positive for congestion, ear pain (Also very itchy), hoarse voice, postnasal drip, rhinorrhea, sinus pain, sinus pressure, sneezing and voice change. Negative for ear discharge, nosebleeds, sore throat, tinnitus and trouble swallowing.   Eyes: Positive for pain. Negative for photophobia, discharge, redness, itching and visual disturbance.  Respiratory: Negative for apnea, cough, choking, chest tightness, shortness of breath, wheezing and stridor.   Gastrointestinal: Negative.   Neurological: Positive for light-headedness and headaches. Negative for dizziness.    Social History  Substance Use Topics  . Smoking status: Never Smoker  . Smokeless tobacco: Never Used  . Alcohol use 0.0 oz/week     Comment: very rarely   Objective:   BP 124/78 (BP Location: Left Arm, Patient Position: Sitting, Cuff Size: Normal)   Pulse 88   Temp 98.5 F (36.9 C) (Oral)   Resp 16   Wt 170 lb (77.1 kg)   BMI 28.29 kg/m   Physical Exam  Constitutional: She is oriented to person, place, and time. She appears well-developed and well-nourished. No distress.  HENT:  Right Ear: External ear normal.  Left Ear: External ear normal.  Nose: Right sinus exhibits maxillary sinus tenderness and frontal sinus tenderness. Left sinus exhibits maxillary sinus tenderness and frontal sinus tenderness.  Mouth/Throat: Oropharynx is clear and moist. No oropharyngeal exudate, posterior oropharyngeal edema or posterior oropharyngeal erythema.  Tms opaque bilaterally   Eyes: Conjunctivae are normal. Right eye exhibits no discharge. Left  eye exhibits no discharge.  Neck: Neck supple.  Cardiovascular: Normal rate and regular rhythm.   Pulmonary/Chest: Effort normal and breath sounds normal.  Lymphadenopathy:    She has cervical adenopathy.  Neurological: She is alert and oriented to person, place, and time.    Skin: Skin is warm and dry. She is not diaphoretic.  Psychiatric: She has a normal mood and affect. Her behavior is normal.        Assessment & Plan:      Problem List Items Addressed This Visit    None    Visit Diagnoses    Chronic sinusitis, unspecified location    -  Primary   Relevant Orders   Ambulatory referral to ENT     Patient is 45 y.o female presenting with chronic sinusitis. Has been treated with above antibiotic course. Patient is reluctant to try more antibiotics because of the many side effects she experienced with her last treatment. I think it is reasonable to wait due to her incomplete resolution on previous antibiotic therapy. Patient will call back in before end of week if she is declining. I have referred her to ENT today.   No Follow-up on file.   Patient Instructions  Sinusitis, Adult Sinusitis is soreness and inflammation of your sinuses. Sinuses are hollow spaces in the bones around your face. They are located:  Around your eyes.  In the middle of your forehead.  Behind your nose.  In your cheekbones. Your sinuses and nasal passages are lined with a stringy fluid (mucus). Mucus normally drains out of your sinuses. When your nasal tissues get inflamed or swollen, the mucus can get trapped or blocked so air cannot flow through your sinuses. This lets bacteria, viruses, and funguses grow, and that leads to infection. Follow these instructions at home: Medicines  Take, use, or apply over-the-counter and prescription medicines only as told by your doctor. These may include nasal sprays.  If you were prescribed an antibiotic medicine, take it as told by your doctor. Do not stop taking the antibiotic even if you start to feel better. Hydrate and Humidify  Drink enough water to keep your pee (urine) clear or pale yellow.  Use a cool mist humidifier to keep the humidity level in your home above 50%.  Breathe in steam for 10-15 minutes, 3-4 times a day  or as told by your doctor. You can do this in the bathroom while a hot shower is running.  Try not to spend time in cool or dry air. Rest  Rest as much as possible.  Sleep with your head raised (elevated).  Make sure to get enough sleep each night. General instructions  Put a warm, moist washcloth on your face 3-4 times a day or as told by your doctor. This will help with discomfort.  Wash your hands often with soap and water. If there is no soap and water, use hand sanitizer.  Do not smoke. Avoid being around people who are smoking (secondhand smoke).  Keep all follow-up visits as told by your doctor. This is important. Contact a doctor if:  You have a fever.  Your symptoms get worse.  Your symptoms do not get better within 10 days. Get help right away if:  You have a very bad headache.  You cannot stop throwing up (vomiting).  You have pain or swelling around your face or eyes.  You have trouble seeing.  You feel confused.  Your neck is stiff.  You have trouble breathing.  This information is not intended to replace advice given to you by your health care provider. Make sure you discuss any questions you have with your health care provider. Document Released: 06/09/2007 Document Revised: 08/17/2015 Document Reviewed: 10/16/2014 Elsevier Interactive Patient Education  2017 [employer]   The entirety of the information documented in the History of Present Illness, Review of Systems and Physical Exam were personally obtained by me. Portions of this information were initially documented by Kavin LeechLaura Loucinda Croy, CMA and reviewed by me for thoroughness and accuracy.          Trey SailorsAdriana M Pollak, PA-C  [facility]

## 2015-11-21 ENCOUNTER — Encounter: Payer: Self-pay | Admitting: Physician Assistant

## 2015-11-21 ENCOUNTER — Ambulatory Visit (INDEPENDENT_AMBULATORY_CARE_PROVIDER_SITE_OTHER): Payer: 59 | Admitting: Physician Assistant

## 2015-11-21 DIAGNOSIS — J324 Chronic pansinusitis: Secondary | ICD-10-CM

## 2015-11-21 MED ORDER — LEVOFLOXACIN 500 MG PO TABS: 500.0000 mg | ORAL_TABLET | Freq: Every day | ORAL | 0 refills | Status: AC

## 2015-11-21 NOTE — Progress Notes (Addendum)
[facility]  Chief Complaint  Patient presents with  . Sinusitis  . Neck Pain    Right sided    Subjective:    Patient ID: Leah Burnett, female    DOB: 09-23-1970, 45 y.o.   MRN: [id number]  Upper Respiratory Infection: Leah ItoLeslie S Stopka is a 45 y.o. female with a past medical history significant for Allergic Rhinitis, Sinusitis over the past month, complaining of symptoms of a URI, possible sinusitis. Symptoms include bilateral ear pain, congestion, cough, sore throat and swollen glands. Onset of symptoms was a few months ago, gradually worsening since that time. She also c/o bilateral ear pressure/pain, congestion and lightheadedness for the past 1 month .  She is drinking plenty of fluids. Evaluation to date: employee health treated with a Z-pack, mometasone nasal spray and prednisone, urgent care visit, our office treated with Augmentin 875/125 BI x 10 days. Treatment to date: antibiotics. The treatment has provided minimal relief. Patient feels much the same today as she did when this all started. Patient has been taking Xyzal as well. Today she complains of some right sided neck soreness.   Review of Systems  Constitutional: Positive for diaphoresis and fatigue. Negative for activity change, appetite change, chills, fever and unexpected weight change.  HENT: Positive for congestion, ear pain (Also itching), postnasal drip, sinus pain, sinus pressure and sore throat. Negative for ear discharge, nosebleeds, rhinorrhea, tinnitus, trouble swallowing and voice change.   Eyes: Positive for discharge. Negative for photophobia, pain, redness, itching and visual disturbance.  Respiratory: Positive for cough and shortness of breath. Negative for apnea, choking, chest tightness, wheezing and stridor.   Gastrointestinal: Negative.   Musculoskeletal: Positive for neck pain. Negative for neck stiffness.  Neurological: Positive for weakness, light-headedness and headaches. Negative  for dizziness.       Objective:   BP 134/88 (BP Location: Left Arm, Patient Position: Sitting, Cuff Size: Normal)   Pulse 76   Temp 98.5 F (36.9 C) (Oral)   Resp 16   Wt 170 lb (77.1 kg)   BMI 28.29 kg/m   Patient Active Problem List   Diagnosis Date Noted  . Degeneration of intervertebral disc of lumbar region 01/28/2015  . Neuritis or radiculitis due to rupture of lumbar intervertebral disc 01/28/2015  . Adjustment disorder with anxiety 04/30/2014  . Allergic rhinitis 04/30/2014  . Anxiety 04/30/2014  . Fibrositis 04/30/2014  . Hot flash, menopausal 04/30/2014  . Cannot sleep 04/30/2014  . SVT (supraventricular tachycardia) (HCC) 04/30/2014    Outpatient Encounter Prescriptions as of 11/21/2015  Medication Sig Note  . KRILL OIL PO Take 2 tablets by mouth daily. 04/30/2014: [facility] Received Sig:   . mometasone (NASONEX) 50 MCG/ACT nasal spray Place 2 sprays into the nose daily.   Marland Kitchen. zolpidem (AMBIEN) 10 MG tablet TAKE 1 TABLET BY MOUTH AT BEDTIME AS NEEDED   . levofloxacin (LEVAQUIN) 500 MG tablet Take 1 tablet (500 mg total) by mouth daily.    No facility-administered encounter medications on file as of 11/21/2015.     No Known Allergies     Physical Exam  Constitutional: She is oriented to person, place, and time. She appears well-developed and well-nourished.  Non-toxic appearance. No distress.  HENT:  Right Ear: External ear normal.  Left Ear: External ear normal.  Nose: Right sinus exhibits maxillary sinus tenderness and frontal sinus tenderness. Left sinus exhibits maxillary sinus tenderness and frontal sinus tenderness.  Mouth/Throat: Oropharynx is clear and moist. No oropharyngeal  exudate, posterior oropharyngeal edema or posterior oropharyngeal erythema.  Tms opaque bilaterally   Eyes: Conjunctivae are normal. Right eye exhibits no discharge. Left eye exhibits no discharge.  Neck: Normal range of motion. Neck supple. No  neck rigidity. No erythema and normal range of motion present. No Brudzinski's sign noted. No thyromegaly present.  Cardiovascular: Normal rate and regular rhythm.   Pulmonary/Chest: Effort normal and breath sounds normal.  Lymphadenopathy:    She has cervical adenopathy.  Neurological: She is alert and oriented to person, place, and time.  Skin: Skin is warm and dry. She is not diaphoretic.  Psychiatric: She has a normal mood and affect. Her behavior is normal.       Assessment & Plan:   Problem List Items Addressed This Visit    None    Visit Diagnoses    Chronic pansinusitis       Relevant Medications   levofloxacin (LEVAQUIN) 500 MG tablet   Other Relevant Orders   CBC with Differential   Ambulatory referral to ENT   CT Maxillofacial WO CM     Problem List Items Addressed This Visit    None    Visit Diagnoses    Chronic pansinusitis       Relevant Medications   levofloxacin (LEVAQUIN) 500 MG tablet   Other Relevant Orders   CBC with Differential   Ambulatory referral to ENT   CT Maxillofacial WO CM      Patient presenting with continuation of sinusitis. Patient does not look toxic today in office, no meningeal signs. Reviewed patient with Dr. Sherrie MustacheFisher.  She has been treated with amoxicillin, Augmentin, and prednisone without relief. Patient using Nasonex, 2nd gen antihistamine. Recommend netti pot with sterile/distilled water. Discussed with patient proceeding with broader spectrum antibiotic vs. Imaging 2/2 length of symptoms. Patient would like to pursue imaging, so have ordered CT sinus. Patient had ENT appointment with Dr. Nicolette BangYeungle in late December, though this is very far out. Have re-referred to see if there is earlier appointment with a different provider.   Have discussed use of levofloxacin, benefits and harms, including possibility of Achilles Tendon Rupture. Patient wishes to have this ordered, but not proceed with it unless she severely deteriorates.  May  try NSAIDs for lymphadenopathy.   Return if symptoms worsen or fail to improve.  The entirety of the information documented in the History of Present Illness, Review of Systems and Physical Exam were personally obtained by me. Portions of this information were initially documented by Kavin LeechLaura Walsh, CMA and reviewed by me for thoroughness and accuracy.    Patient Instructions  Sinusitis, Adult Sinusitis is soreness and inflammation of your sinuses. Sinuses are hollow spaces in the bones around your face. They are located:  Around your eyes.  In the middle of your forehead.  Behind your nose.  In your cheekbones. Your sinuses and nasal passages are lined with a stringy fluid (mucus). Mucus normally drains out of your sinuses. When your nasal tissues get inflamed or swollen, the mucus can get trapped or blocked so air cannot flow through your sinuses. This lets bacteria, viruses, and funguses grow, and that leads to infection. Follow these instructions at home: Medicines  Take, use, or apply over-the-counter and prescription medicines only as told by your doctor. These may include nasal sprays.  If you were prescribed an antibiotic medicine, take it as told by your doctor. Do not stop taking the antibiotic even if you start to feel better. Hydrate and Humidify  Drink enough water to keep your pee (urine) clear or pale yellow.  Use a cool mist humidifier to keep the humidity level in your home above 50%.  Breathe in steam for 10-15 minutes, 3-4 times a day or as told by your doctor. You can do this in the bathroom while a hot shower is running.  Try not to spend time in cool or dry air. Rest  Rest as much as possible.  Sleep with your head raised (elevated).  Make sure to get enough sleep each night. General instructions  Put a warm, moist washcloth on your face 3-4 times a day or as told by your doctor. This will help with discomfort.  Wash your hands often with soap and  water. If there is no soap and water, use hand sanitizer.  Do not smoke. Avoid being around people who are smoking (secondhand smoke).  Keep all follow-up visits as told by your doctor. This is important. Contact a doctor if:  You have a fever.  Your symptoms get worse.  Your symptoms do not get better within 10 days. Get help right away if:  You have a very bad headache.  You cannot stop throwing up (vomiting).  You have pain or swelling around your face or eyes.  You have trouble seeing.  You feel confused.  Your neck is stiff.  You have trouble breathing. This information is not intended to replace advice given to you by your health care provider. Make sure you discuss any questions you have with your health care provider. Document Released: 06/09/2007 Document Revised: 08/17/2015 Document Reviewed: 10/16/2014 Elsevier Interactive Patient Education  2017 [employer].

## 2015-11-21 NOTE — Patient Instructions (Signed)

## 2015-11-22 LAB — CBC WITH DIFFERENTIAL/PLATELET
Basophils Absolute: 0 10*3/uL (ref 0.0–0.2)
Basos: 0 %
EOS (ABSOLUTE): 0.1 10*3/uL (ref 0.0–0.4)
Eos: 1 %
Hematocrit: 38.3 % (ref 34.0–46.6)
Hemoglobin: 12.7 g/dL (ref 11.1–15.9)
Immature Grans (Abs): 0 10*3/uL (ref 0.0–0.1)
Immature Granulocytes: 0 %
Lymphocytes Absolute: 2.6 10*3/uL (ref 0.7–3.1)
Lymphs: 35 %
MCH: 28.1 pg (ref 26.6–33.0)
MCHC: 33.2 g/dL (ref 31.5–35.7)
MCV: 85 fL (ref 79–97)
Monocytes Absolute: 0.6 10*3/uL (ref 0.1–0.9)
Monocytes: 8 %
Neutrophils Absolute: 4 10*3/uL (ref 1.4–7.0)
Neutrophils: 56 %
Platelets: 350 10*3/uL (ref 150–379)
RBC: 4.52 x10E6/uL (ref 3.77–5.28)
RDW: 14 % (ref 12.3–15.4)
WBC: 7.3 10*3/uL (ref 3.4–10.8)

## 2015-11-24 ENCOUNTER — Telehealth: Payer: Self-pay

## 2015-11-24 NOTE — Telephone Encounter (Signed)
Patient was advised. KW 

## 2015-11-24 NOTE — Telephone Encounter (Signed)
-----   Message from Trey SailorsAdriana M Pollak, [place]C sent at 11/24/2015  1:39 PM EST ----- CBC was normal, no elevated white count. Looks like CT has been scheduled for 11/26/2015. Working on earlier referral. Patient may try to do nettipot with distilled/sterile water if she hasn't already.

## 2015-11-26 ENCOUNTER — Ambulatory Visit
Admission: RE | Admit: 2015-11-26 | Discharge: 2015-11-26 | Disposition: A | Discharge status: Home / Self Care | Payer: 59 | Source: Ambulatory Visit | Attending: Physician Assistant | Admitting: Physician Assistant

## 2015-11-26 ENCOUNTER — Telehealth: Payer: Self-pay

## 2015-11-26 DIAGNOSIS — J324 Chronic pansinusitis: Secondary | ICD-10-CM | POA: Diagnosis not present

## 2015-11-26 DIAGNOSIS — J329 Chronic sinusitis, unspecified: Secondary | ICD-10-CM | POA: Diagnosis not present

## 2015-11-26 NOTE — Telephone Encounter (Signed)
Patient has been advised. KW 

## 2015-11-26 NOTE — Telephone Encounter (Signed)
-----   Message from Trey SailorsAdriana M Pollak, [place] sent at 11/26/2015 11:20 AM EST ----- CT sinus did not show any findings. Patient may have chronic subacute sinusitis. She should keep her appointment with Dr. Nicolette BangYeungle at ENT. Continue taking her flonase. Levofloxacin is of uncertain benefit at this point, if she can wait until ENT appointment that may be best.

## 2015-11-26 NOTE — Telephone Encounter (Signed)
Attempted to call patient mailbox was full 

## 2015-12-01 ENCOUNTER — Ambulatory Visit: Payer: Self-pay

## 2015-12-16 DIAGNOSIS — J3501 Chronic tonsillitis: Secondary | ICD-10-CM | POA: Diagnosis not present

## 2015-12-16 DIAGNOSIS — J301 Allergic rhinitis due to pollen: Secondary | ICD-10-CM | POA: Diagnosis not present

## 2015-12-16 DIAGNOSIS — R070 Pain in throat: Secondary | ICD-10-CM | POA: Diagnosis not present

## 2015-12-18 ENCOUNTER — Ambulatory Visit: Payer: 59 | Admitting: Family Medicine

## 2015-12-24 ENCOUNTER — Ambulatory Visit: Payer: 59 | Admitting: Family Medicine

## 2016-01-15 ENCOUNTER — Ambulatory Visit (INDEPENDENT_AMBULATORY_CARE_PROVIDER_SITE_OTHER): Payer: 59 | Admitting: Family Medicine

## 2016-01-15 ENCOUNTER — Ambulatory Visit
Admission: RE | Admit: 2016-01-15 | Discharge: 2016-01-15 | Disposition: A | Discharge status: Home / Self Care | Payer: 59 | Source: Ambulatory Visit | Attending: Family Medicine | Admitting: Family Medicine

## 2016-01-15 ENCOUNTER — Encounter: Payer: Self-pay | Admitting: Family Medicine

## 2016-01-15 VITALS — BP 118/76 | HR 98 | Temp 98.1°F | Resp 18 | Wt 169.0 lb

## 2016-01-15 DIAGNOSIS — R05 Cough: Secondary | ICD-10-CM

## 2016-01-15 DIAGNOSIS — R079 Chest pain, unspecified: Secondary | ICD-10-CM

## 2016-01-15 DIAGNOSIS — R059 Cough, unspecified: Secondary | ICD-10-CM

## 2016-01-15 DIAGNOSIS — J40 Bronchitis, not specified as acute or chronic: Secondary | ICD-10-CM

## 2016-01-15 MED ORDER — HYDROCOD POLST-CPM POLST ER 10-8 MG/5ML PO SUER: 5.0000 mL | Freq: Every evening | ORAL | 0 refills | Status: DC | PRN

## 2016-01-15 MED ORDER — DOXYCYCLINE HYCLATE 100 MG PO TABS: 100.0000 mg | ORAL_TABLET | Freq: Two times a day (BID) | ORAL | 0 refills | Status: DC

## 2016-01-15 MED ORDER — BENZONATATE 200 MG PO CAPS: 200.0000 mg | ORAL_CAPSULE | Freq: Three times a day (TID) | ORAL | 0 refills | Status: DC | PRN

## 2016-01-15 NOTE — Progress Notes (Signed)
[name]  MRN: [id number] DOB: [birth year]  Subjective:  HPI   The patient is a 46 year old female who presents for evaluation of cough.  She has been experiencing symptoms of allergies with sinus congestion and a lot of post nasal drainage with sneezes.  She started having cough for about 1 week.  It is non productive and she has not had any known fever.  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  Her cough is non productive and she states she feels like her chest is heavy, burning, and tight. She has been using Flonase, Day Quill, Delphi and Mucinex.  She states that none have helped and it is now affecting her sleep.  Patient Active Problem List   Diagnosis Date Noted  . Degeneration of intervertebral disc of lumbar region 01/28/2015  . Neuritis or radiculitis due to rupture of lumbar intervertebral disc 01/28/2015  . Adjustment disorder with anxiety 04/30/2014  . Allergic rhinitis 04/30/2014  . Anxiety 04/30/2014  . Fibrositis 04/30/2014  . Hot flash, menopausal 04/30/2014  . Cannot sleep 04/30/2014  . SVT (supraventricular tachycardia) (HCC) 04/30/2014    Past Medical History:  Diagnosis Date  . Duodenitis   . Endometriosis 2002  . External thrombosed hemorrhoids 2009  . Hypertension     Social History   Social History  . Marital status: Married    Spouse name: N/A  . Number of children: N/A  . Years of education: N/A   Occupational History  . Not on file.   Social History Main Topics  . Smoking status: Never Smoker  . Smokeless tobacco: Never Used  . Alcohol use 0.0 oz/week     Comment: very rarely  . Drug use: No  . Sexual activity: Not on file   Other Topics Concern  . Not on file   Social History Narrative  . No narrative on file    Outpatient Encounter Prescriptions as of 01/15/2016  Medication Sig Note  . fluticasone (FLONASE) 50 MCG/ACT  nasal spray Place into both nostrils daily.   Marland Kitchen KRILL OIL PO Take 2 tablets by mouth daily. 04/30/2014: Received from: [facility] Received Sig:   . zolpidem (AMBIEN) 10 MG tablet TAKE 1 TABLET BY MOUTH AT BEDTIME AS NEEDED   . [DISCONTINUED] mometasone (NASONEX) 50 MCG/ACT nasal spray Place 2 sprays into the nose daily.    No facility-administered encounter medications on file as of 01/15/2016.     No Known Allergies  Review of Systems  Constitutional: Positive for malaise/fatigue. Negative for chills, diaphoresis, fever and weight loss.  HENT: Positive for congestion, ear pain and tinnitus. Negative for ear discharge, hearing loss, nosebleeds, sinus pain and sore throat.   Eyes: Negative for blurred vision, double vision, photophobia, pain, discharge and redness.  Respiratory: Positive for cough, shortness of breath and wheezing. Negative for hemoptysis and sputum production.   Cardiovascular: Positive for chest pain (heavy tight burning). Negative for palpitations, orthopnea, claudication, leg swelling and PND.  Gastrointestinal: Positive for nausea. Negative for vomiting.  Neurological: Negative for weakness.    Objective:  BP 118/76 (BP Location: Right Arm, Patient Position: Sitting, Cuff Size: Normal)   Pulse 98   Temp 98.1 F (36.7 C) (Oral)   Resp 18   Wt 169 lb (76.7 kg)   SpO2 99%   BMI 28.12 kg/m   Physical Exam  Constitutional:  She is oriented to person, place, and time and well-developed, well-nourished, and in no distress.  HENT:  Head: Normocephalic and atraumatic.  Right Ear: External ear normal.  Left Ear: External ear normal.  Nose: Nose normal.  Mouth/Throat: Oropharynx is clear and moist.  Eyes: Conjunctivae are normal. Pupils are equal, round, and reactive to light.  Neck: Normal range of motion. Neck supple.  Cardiovascular: Regular rhythm, normal heart sounds and intact distal pulses.   Pulmonary/Chest: Effort normal and breath sounds  normal.  LUL rhonchi  Neurological: She is alert and oriented to person, place, and time. Gait normal.  Skin: Skin is warm and dry.  Psychiatric: Mood, memory, affect and judgment normal.    Assessment and Plan :  1. Bronchitis  - doxycycline (VIBRA-TABS) 100 MG tablet; Take 1 tablet (100 mg total) by mouth 2 (two) times daily.  Dispense: 10 tablet; Refill: 0 - DG Chest 2 View; Future - chlorpheniramine-HYDROcodone (TUSSIONEX PENNKINETIC ER) 10-8 MG/5ML SUER; Take 5 mLs by mouth at bedtime as needed for cough.  Dispense: 140 mL; Refill: 0  2. Cough  - benzonatate (TESSALON) 200 MG capsule; Take 1 capsule (200 mg total) by mouth 3 (three) times daily as needed for cough.  Dispense: 20 capsule; Refill: 0 - DG Chest 2 View; Future - chlorpheniramine-HYDROcodone (TUSSIONEX PENNKINETIC ER) 10-8 MG/5ML SUER; Take 5 mLs by mouth at bedtime as needed for cough.  Dispense: 140 mL; Refill: 0  3. Chest pain, unspecified type  - DG Chest 2 View; Future    HPI, Exam and A&P Transcribed under the direction and in the presence of Julieanne Mansonichard Gilbert, Montez HagemanJr., MD. Electronically Signed: Janey GreaserElena Burnett, RMA I have done the exam and reviewed the chart and it is accurate to the best of my knowledge. [employer]  technology has been used and  any errors in dictation or transcription are unintentional. Julieanne Mansonichard Gilbert M.D. [facility]

## 2016-01-16 NOTE — Progress Notes (Signed)
Advised  ED 

## 2016-04-29 ENCOUNTER — Other Ambulatory Visit: Payer: Self-pay | Admitting: Family Medicine

## 2016-05-07 ENCOUNTER — Encounter: Payer: Self-pay | Admitting: Physician Assistant

## 2016-05-07 ENCOUNTER — Ambulatory Visit (INDEPENDENT_AMBULATORY_CARE_PROVIDER_SITE_OTHER): Payer: 59 | Admitting: Physician Assistant

## 2016-05-07 VITALS — BP 142/92 | HR 84 | Temp 98.6°F | Resp 16 | Wt 169.0 lb

## 2016-05-07 DIAGNOSIS — R110 Nausea: Secondary | ICD-10-CM

## 2016-05-07 DIAGNOSIS — G43009 Migraine without aura, not intractable, without status migrainosus: Secondary | ICD-10-CM

## 2016-05-07 MED ORDER — PROMETHAZINE HCL 12.5 MG PO TABS: 12.5000 mg | ORAL_TABLET | Freq: Four times a day (QID) | ORAL | 0 refills | Status: DC | PRN

## 2016-05-07 MED ORDER — KETOROLAC TROMETHAMINE 60 MG/2ML IM SOLN
60.0000 mg | Freq: Once | INTRAMUSCULAR | Status: AC
  Administered 2016-05-07: 60 mg via INTRAMUSCULAR

## 2016-05-07 MED ORDER — KETOROLAC TROMETHAMINE 10 MG PO TABS: ORAL_TABLET | ORAL | 0 refills | Status: DC

## 2016-05-07 NOTE — Progress Notes (Signed)
Patient: Leah Burnett Female    DOB: 03-15-1970   46 y.o.   MRN: [id number] Visit Date: 05/07/2016  Today's Provider: Trey SailorsAdriana M Pollak, PA-C   Chief Complaint  Patient presents with  . Headache  . Hypertension    BP was 166/113 yesterday.   Subjective:    Headache   This is a new problem. The current episode started yesterday. The problem has been gradually improving. The pain is located in the right unilateral region. The pain does not radiate. The pain quality is similar to prior headaches (Pt has had "on and off headaches a long time ago". ). The quality of the pain is described as throbbing (Pressure). The pain is at a severity of 7/10 (Pt reports yesterday was about a "15/10"). The pain is severe. Associated symptoms include dizziness, nausea, phonophobia, sinus pressure and tinnitus. Pertinent negatives include no abdominal pain, abnormal behavior, anorexia, back pain, blurred vision, ear pain, eye pain, eye redness, facial sweating, fever, hearing loss, insomnia, loss of balance, muscle aches, neck pain, numbness, photophobia, rhinorrhea, scalp tenderness, seizures, sore throat, swollen glands, tingling, visual change, vomiting, weakness or weight loss. She has tried acetaminophen (Rest) for the symptoms. The treatment provided mild relief. Her past medical history is significant for hypertension.  Hypertension  The problem is uncontrolled. Associated symptoms include headaches and malaise/fatigue. Pertinent negatives include no blurred vision or neck pain.   Patient is a 46 y/o relatively healthy woman with history of similar headaches in the 1990's who is presenting today with a headache ongoing for one day. It started yesterday with the above pertinent qualities, but to sum up: right sided, throbbing, phonophobic but not photophobic, nausea without vomiting. No head injuries or sinus congestion. No problems walking. Not the worst headache of her life. No fevers, chills. She  took her BP yesterday and it was elevated 160/113. She was concerned about this.  Of note, the patient reports that in the 1990's, before her children were born, she saw neurologist Dr. Chestine Sporelark. She had an MRI performed which was normal. She was treated with a beta blocker for her headaches with good success. At some point, she discontinued treatment.    No Known Allergies   Current Outpatient Prescriptions:  .  fluticasone (FLONASE) 50 MCG/ACT nasal spray, Place into both nostrils daily., Disp: , Rfl:  .  KRILL OIL PO, Take 2 tablets by mouth daily., Disp: , Rfl:  .  zolpidem (AMBIEN) 10 MG tablet, TAKE 1 TABLET BY MOUTH AT BEDTIME AS NEEDED, Disp: 30 tablet, Rfl: 5  Review of Systems  Constitutional: Positive for fatigue and malaise/fatigue. Negative for activity change, appetite change, chills, diaphoresis, fever, unexpected weight change and weight loss.  HENT: Positive for sinus pain, sinus pressure and tinnitus. Negative for congestion, ear discharge, ear pain, hearing loss, nosebleeds, postnasal drip, rhinorrhea, sneezing and sore throat.   Eyes: Negative for blurred vision, photophobia, pain, discharge, redness, itching and visual disturbance.  Respiratory: Negative.   Cardiovascular: Negative.   Gastrointestinal: Positive for nausea. Negative for abdominal distention, abdominal pain, anal bleeding, anorexia, blood in stool, constipation, diarrhea, rectal pain and vomiting.  Musculoskeletal: Positive for neck stiffness. Negative for arthralgias, back pain, gait problem, joint swelling, myalgias and neck pain.  Neurological: Positive for dizziness, light-headedness and headaches. Negative for tingling, tremors, seizures, syncope, facial asymmetry, speech difficulty, weakness, numbness and loss of balance.  Psychiatric/Behavioral: The patient does not have insomnia.     Social History  Substance Use Topics  . Smoking status: Never Smoker  . Smokeless tobacco: Never Used  . Alcohol  use 0.0 oz/week     Comment: very rarely   Objective:   BP (!) 142/92 (BP Location: Left Arm, Patient Position: Sitting, Cuff Size: Normal)   Pulse 84   Temp 98.6 F (37 C) (Oral)   Resp 16   Wt 169 lb (76.7 kg)   BMI 28.12 kg/m  Vitals:   05/07/16 0945  BP: (!) 142/92  Pulse: 84  Resp: 16  Temp: 98.6 F (37 C)  TempSrc: Oral  Weight: 169 lb (76.7 kg)     Physical Exam  Constitutional: She is oriented to person, place, and time. She appears well-developed and well-nourished. No distress.  HENT:  Right Ear: Tympanic membrane and external ear normal.  Left Ear: Tympanic membrane and external ear normal.  Mouth/Throat: Oropharynx is clear and moist. No oropharyngeal exudate.  Eyes: Conjunctivae and EOM are normal. Pupils are equal, round, and reactive to light.  Neck: Normal range of motion and full passive range of motion without pain. Neck supple. No neck rigidity. No edema and normal range of motion present. No Brudzinski's sign noted.  Cardiovascular: Normal rate and regular rhythm.   Pulmonary/Chest: Effort normal and breath sounds normal.  Lymphadenopathy:    She has no cervical adenopathy.  Neurological: She is alert and oriented to person, place, and time. She has normal reflexes. She displays normal reflexes. No cranial nerve deficit. Coordination and gait normal. GCS eye subscore is 4. GCS verbal subscore is 5. GCS motor subscore is 6.  Finger nose finger normal.  Skin: Skin is warm and dry. She is not diaphoretic.  Psychiatric: She has a normal mood and affect. Her behavior is normal.        Assessment & Plan:     1. Migraine without aura and without status migrainosus, not intractable  Patient has had similar headaches before. Though she never specifically calls it a migraine, her evaluation by a neurologist with an MRI and subsequent treatment with a beta blocker support a diagnosis of prior migraines. Patient is concerned about her BP. It is slightly elevated  today in office, which could be due to pain. She has no focal neuro deficits on exam or meningeal signs. Last Scr was in 2013, reviewed with Dr. Sherrie Mustache, OK to give Toradol IM. She declines phenergan injection. Instructed her not to start oral Toradol until tomorrow if she needs it. Max 40 mg daily, use no longer than 5 days, with this being day one. Counseled on return precautions and signs/symptoms to seek emergency care. She will follow up on Wednesday of next week for further discussion of BP mgmt/migraine prophylaxis.   - ketorolac (TORADOL) 10 MG tablet; Take two pills to start and then one every 6 hours as needed. Max 40 mg daily. Use no longer than five days.  Dispense: 6 tablet; Refill: 0 - ketorolac (TORADOL) injection 60 mg; Inject 2 mLs (60 mg total) into the muscle once.  2. Nausea  Counseled on sedation risk.  - promethazine (PHENERGAN) 12.5 MG tablet; Take 1 tablet (12.5 mg total) by mouth every 6 (six) hours as needed for nausea or vomiting.  Dispense: 30 tablet; Refill: 0  Return in about 5 days (around 05/12/2016) for migraine.  The entirety of the information documented in the History of Present Illness, Review of Systems and Physical Exam were personally obtained by me. Portions of this information were initially documented by  Kavin Leech, CMA and reviewed by me for thoroughness and accuracy.   I have spent 25 minutes with this patient, >50% of which was spent on counseling and coordination of care.      Trey Sailors, PA-C  [facility]

## 2016-05-07 NOTE — Patient Instructions (Addendum)
Do not start your oral Toradol until tomorrow, 05/08/2016. Take no longer than 5 days.   Migraine Headache A migraine headache is a very strong throbbing pain on one side or both sides of your head. Migraines can also cause other symptoms. Talk with your doctor about what things may bring on (trigger) your migraine headaches. Follow these instructions at home: Medicines   Take over-the-counter and prescription medicines only as told by your doctor.  Do not drive or use heavy machinery while taking prescription pain medicine.  To prevent or treat constipation while you are taking prescription pain medicine, your doctor may recommend that you:  Drink enough fluid to keep your pee (urine) clear or pale yellow.  Take over-the-counter or prescription medicines.  Eat foods that are high in fiber. These include fresh fruits and vegetables, whole grains, and beans.  Limit foods that are high in fat and processed sugars. These include fried and sweet foods. Lifestyle   Avoid alcohol.  Do not use any products that contain nicotine or tobacco, such as cigarettes and e-cigarettes. If you need help quitting, ask your doctor.  Get at least 8 hours of sleep every night.  Limit your stress. General instructions    Keep a journal to find out what may bring on your migraines. For example, write down:  What you eat and drink.  How much sleep you get.  Any change in what you eat or drink.  Any change in your medicines.  If you have a migraine:  Avoid things that make your symptoms worse, such as bright lights.  It may help to lie down in a dark, quiet room.  Do not drive or use heavy machinery.  Ask your doctor what activities are safe for you.  Keep all follow-up visits as told by your doctor. This is important. Contact a doctor if:  You get a migraine that is different or worse than your usual migraines. Get help right away if:  Your migraine gets very bad.  You have a  fever.  You have a stiff neck.  You have trouble seeing.  Your muscles feel weak or like you cannot control them.  You start to lose your balance a lot.  You start to have trouble walking.  You pass out (faint). This information is not intended to replace advice given to you by your health care provider. Make sure you discuss any questions you have with your health care provider. Document Released: 09/30/2007 Document Revised: 07/11/2015 Document Reviewed: 06/09/2015 Elsevier Interactive Patient Education  2017 [employer]

## 2016-05-12 ENCOUNTER — Encounter: Payer: Self-pay | Admitting: Physician Assistant

## 2016-05-12 ENCOUNTER — Ambulatory Visit (INDEPENDENT_AMBULATORY_CARE_PROVIDER_SITE_OTHER): Payer: 59 | Admitting: Physician Assistant

## 2016-05-12 VITALS — BP 138/88 | HR 84 | Temp 98.6°F | Resp 16 | Wt 172.0 lb

## 2016-05-12 DIAGNOSIS — I10 Essential (primary) hypertension: Secondary | ICD-10-CM

## 2016-05-12 DIAGNOSIS — G43009 Migraine without aura, not intractable, without status migrainosus: Secondary | ICD-10-CM | POA: Insufficient documentation

## 2016-05-12 MED ORDER — PROPRANOLOL HCL 40 MG PO TABS: 40.0000 mg | ORAL_TABLET | Freq: Every day | ORAL | 2 refills | Status: DC

## 2016-05-12 NOTE — Progress Notes (Signed)
Patient: Leah Burnett Female    DOB: May 24, 1970   46 y.o.   MRN: [id number] Visit Date: 05/12/2016  Today's Provider: Trey SailorsAdriana M Jaden Batchelder, PA-C   Chief Complaint  Patient presents with  . Hypertension  . Migraine   Subjective:    Hypertension  This is a recurrent problem. The problem has been gradually worsening since onset. The problem is uncontrolled (Pt reports her blood pressure runs around 140/90-100's). Associated symptoms include blurred vision, headaches, malaise/fatigue and palpitations. Pertinent negatives include no anxiety, chest pain, neck pain, orthopnea, peripheral edema, PND, shortness of breath or sweats.  Migraine   Quality: Pressure. The pain is at a severity of 4/10. Associated symptoms include blurred vision, dizziness, nausea, phonophobia and tinnitus. Pertinent negatives include no abdominal pain, ear pain, fever, neck pain, numbness, photophobia, rhinorrhea, seizures, sinus pressure, sore throat, vomiting or weakness. Her past medical history is significant for hypertension.   Leah Burnett is a 46 y/o woman with a remote history of migraines who saw Neurologist Dr. Chestine Sporelark in [phone]'s presenting today for follow up migraine. The severity of her headache has improved. She took one oral Toradol at home. The headache is not constant. It comes and goes. Associated with phonophobia, nausea. She has taken on Phenergan with good relief. She cannot identify triggers. She says the headache shows up at different times of the day depending on her day. Not worse with exertion. No tinnitus. She has not had problems with persistent headaches since last seeing neurology. Her vision is blurry but this is normal visual impairment for her, she wears bifocals. No loss of peripheral vision.  She also has been monitoring her BP. She says it runs high out of office, 140's/90-100 or so. No chest pain. No SOB.     No Known Allergies   Current Outpatient Prescriptions:  .   fluticasone (FLONASE) 50 MCG/ACT nasal spray, Place into both nostrils daily., Disp: , Rfl:  .  ketorolac (TORADOL) 10 MG tablet, Take two pills to start and then one every 6 hours as needed. Max 40 mg daily. Use no longer than five days., Disp: 6 tablet, Rfl: 0 .  KRILL OIL PO, Take 2 tablets by mouth daily., Disp: , Rfl:  .  promethazine (PHENERGAN) 12.5 MG tablet, Take 1 tablet (12.5 mg total) by mouth every 6 (six) hours as needed for nausea or vomiting., Disp: 30 tablet, Rfl: 0 .  zolpidem (AMBIEN) 10 MG tablet, TAKE 1 TABLET BY MOUTH AT BEDTIME AS NEEDED, Disp: 30 tablet, Rfl: 5  Review of Systems  Constitutional: Positive for fatigue and malaise/fatigue. Negative for activity change, appetite change, chills, diaphoresis, fever and unexpected weight change.  HENT: Positive for sneezing and tinnitus. Negative for congestion, ear discharge, ear pain, nosebleeds, postnasal drip, rhinorrhea, sinus pain, sinus pressure and sore throat.   Eyes: Positive for blurred vision. Negative for photophobia.  Respiratory: Negative.  Negative for shortness of breath.   Cardiovascular: Positive for palpitations. Negative for chest pain, orthopnea, leg swelling and PND.  Gastrointestinal: Positive for nausea. Negative for abdominal distention, abdominal pain, anal bleeding, blood in stool, constipation, diarrhea, rectal pain and vomiting.  Musculoskeletal: Negative for neck pain.  Neurological: Positive for dizziness, light-headedness and headaches. Negative for tremors, seizures, syncope, facial asymmetry, speech difficulty, weakness and numbness.    Social History  Substance Use Topics  . Smoking status: Never Smoker  . Smokeless tobacco: Never Used  . Alcohol use 0.0 oz/week  Comment: very rarely   Objective:   BP 138/88 (BP Location: Left Arm, Patient Position: Sitting, Cuff Size: Normal)   Pulse 84   Temp 98.6 F (37 C) (Oral)   Resp 16   Wt 172 lb (78 kg)   BMI 28.62 kg/m  Vitals:    05/12/16 0920  BP: 138/88  Pulse: 84  Resp: 16  Temp: 98.6 F (37 C)  TempSrc: Oral  Weight: 172 lb (78 kg)     Physical Exam  Constitutional: She is oriented to person, place, and time. She appears well-developed and well-nourished.  Eyes:  Visual fields full to confrontation.   Cardiovascular: Normal rate and regular rhythm.   Pulmonary/Chest: Effort normal and breath sounds normal.  Neurological: She is alert and oriented to person, place, and time.  Skin: Skin is warm and dry.  Psychiatric: She has a normal mood and affect. Her behavior is normal.        Assessment & Plan:     1. Essential hypertension  EKG with NSR, no AV block. PR = 71 bpm. Patient wants to start migraine prophylaxis. Cautioned on side effects such as syncope, sinus bradycardia. She tolerated beta blocker well in the past.  - EKG 12-Lead - propranolol (INDERAL) 40 MG tablet; Take 1 tablet (40 mg total) by mouth daily.  Dispense: 30 tablet; Refill: 2  2. Migraine without aura and without status migrainosus, not intractable  Symptoms persisting. Treat as below. Declines neurology referral today.  - propranolol (INDERAL) 40 MG tablet; Take 1 tablet (40 mg total) by mouth daily.  Dispense: 30 tablet; Refill: 2  Return in about 2 weeks (around 05/26/2016) for BP, migraine.  The entirety of the information documented in the History of Present Illness, Review of Systems and Physical Exam were personally obtained by me. Portions of this information were initially documented by Kavin Leech, CMA and reviewed by me for thoroughness and accuracy.         Trey Sailors, PA-C  [facility]

## 2016-05-12 NOTE — Patient Instructions (Signed)

## 2016-05-26 ENCOUNTER — Ambulatory Visit: Payer: 59 | Admitting: Family Medicine

## 2016-06-02 ENCOUNTER — Encounter: Payer: Self-pay | Admitting: Family Medicine

## 2016-06-02 ENCOUNTER — Ambulatory Visit (INDEPENDENT_AMBULATORY_CARE_PROVIDER_SITE_OTHER): Payer: 59 | Admitting: Family Medicine

## 2016-06-02 VITALS — BP 118/72 | HR 96 | Temp 98.5°F | Resp 16 | Wt 176.0 lb

## 2016-06-02 DIAGNOSIS — G43009 Migraine without aura, not intractable, without status migrainosus: Secondary | ICD-10-CM

## 2016-06-02 DIAGNOSIS — I10 Essential (primary) hypertension: Secondary | ICD-10-CM | POA: Diagnosis not present

## 2016-06-02 DIAGNOSIS — Z13220 Encounter for screening for lipoid disorders: Secondary | ICD-10-CM | POA: Diagnosis not present

## 2016-06-02 DIAGNOSIS — R601 Generalized edema: Secondary | ICD-10-CM | POA: Diagnosis not present

## 2016-06-02 MED ORDER — HYDROCHLOROTHIAZIDE 12.5 MG PO TABS: 12.5000 mg | ORAL_TABLET | Freq: Every day | ORAL | 12 refills | Status: DC

## 2016-06-02 NOTE — Progress Notes (Signed)
Patient: Leah Burnett Female    DOB: 07-Jan-1970   46 y.o.   MRN: [id number] Visit Date: 06/02/2016  Today's Provider: [name], MD   Chief Complaint  Patient presents with  . Hypertension  . Migraine   Subjective:    HPI      Hypertension, follow-up:  BP Readings from Last 3 Encounters:  06/02/16 118/72  05/12/16 138/88  05/07/16 (!) 142/92    She was last seen for hypertension 3 weeks ago.  BP at that visit was 138/88. Management since that visit includes adding Inderal 40 mg for HTN and migraines. She reports good compliance with treatment. She is not having side effects.  She is not exercising. She is adherent to low salt diet.   Outside blood pressures are better per pt. She is experiencing fatigue and palpitations.  Patient denies chest pain, chest pressure/discomfort, claudication, dyspnea, exertional chest pressure/discomfort, irregular heart beat, lower extremity edema, near-syncope, orthopnea and syncope.   Cardiovascular risk factors include family history of premature cardiovascular disease and hypertension.    Weight trend: increasing steadily Wt Readings from Last 3 Encounters:  06/02/16 176 lb (79.8 kg)  05/12/16 172 lb (78 kg)  05/07/16 169 lb (76.7 kg)    Current diet: in general, a "healthy" diet    ------------------------------------------------------------------------ Pt reports her migraines are about 60% improved since starting the Propranolol. Pt would also like to discuss starting a fluid pill due to edema. She notices that her hands and fingers swell. She has gained 4 pounds since LOV in 3 weeks.  No Known Allergies   Current Outpatient Prescriptions:  .  CINNAMON PO, Take by mouth., Disp: , Rfl:  .  fluticasone (FLONASE) 50 MCG/ACT nasal spray, Place into both nostrils daily., Disp: , Rfl:  .  KRILL OIL PO, Take 2 tablets by mouth daily., Disp: , Rfl:  .  promethazine (PHENERGAN) 12.5 MG tablet, Take 1 tablet  (12.5 mg total) by mouth every 6 (six) hours as needed for nausea or vomiting., Disp: 30 tablet, Rfl: 0 .  propranolol (INDERAL) 40 MG tablet, Take 1 tablet (40 mg total) by mouth daily., Disp: 30 tablet, Rfl: 2 .  zolpidem (AMBIEN) 10 MG tablet, TAKE 1 TABLET BY MOUTH AT BEDTIME AS NEEDED, Disp: 30 tablet, Rfl: 5  Review of Systems  Constitutional: Positive for fatigue and unexpected weight change. Negative for activity change, appetite change, chills, diaphoresis and fever.  Eyes: Negative.   Respiratory: Negative for shortness of breath.   Cardiovascular: Negative for chest pain, palpitations and leg swelling.  Endocrine: Negative.   Allergic/Immunologic: Negative.   Neurological: Positive for headaches (improved).  Hematological: Negative.   Psychiatric/Behavioral: Negative.     Social History  Substance Use Topics  . Smoking status: Never Smoker  . Smokeless tobacco: Never Used  . Alcohol use 0.0 oz/week     Comment: very rarely   Objective:   BP 118/72 (BP Location: Right Arm, Patient Position: Sitting, Cuff Size: Large)   Pulse 96   Temp 98.5 F (36.9 C) (Oral)   Resp 16   Wt 176 lb (79.8 kg)   BMI 29.29 kg/m  Vitals:   06/02/16 1343  BP: 118/72  Pulse: 96  Resp: 16  Temp: 98.5 F (36.9 C)  TempSrc: Oral  Weight: 176 lb (79.8 kg)     Physical Exam  Constitutional: She is oriented to person, place, and time. She appears well-developed and well-nourished.  HENT:  Head:  Normocephalic and atraumatic.  Right Ear: External ear normal.  Left Ear: External ear normal.  Nose: Nose normal.  Eyes: Conjunctivae are normal.  Neck: Normal range of motion.  Cardiovascular: Normal rate, regular rhythm and normal heart sounds.   Pulmonary/Chest: Effort normal and breath sounds normal. No respiratory distress.  Musculoskeletal: Normal range of motion.  Neurological: She is alert and oriented to person, place, and time.  Skin: Skin is warm and dry.  Psychiatric: She has  a normal mood and affect. Her behavior is normal. Judgment and thought content normal.        Assessment & Plan:     1. Essential hypertension Stable. Will check labs. Add HCTZ for HTN and edema. FU pending results. Recheck 2 months. - CBC with Differential/Platelet - Comprehensive metabolic panel - hydrochlorothiazide (HYDRODIURIL) 12.5 MG tablet; Take 1 tablet (12.5 mg total) by mouth daily.  Dispense: 30 tablet; Refill: 12  2. Generalized edema Check labs and start HCTZ as below. - Comprehensive metabolic panel - TSH - hydrochlorothiazide (HYDRODIURIL) 12.5 MG tablet; Take 1 tablet (12.5 mg total) by mouth daily.  Dispense: 30 tablet; Refill: 12  3. Need for lipid screening FU pending results. - Lipid panel   4. Migraine without aura and without status migrainosus, not intractable Stable. Continue Inderal. Pt refuses neurology referral at this time.     I have done the exam and reviewed the above chart and it is accurate to the best of my knowledge. [employer]  technology has been used in this note in any air is in the dictation or transcription are unintentional.  Megan Mansichard Desmon Hitchner Jr, MD  [facility]

## 2016-06-18 DIAGNOSIS — H524 Presbyopia: Secondary | ICD-10-CM | POA: Diagnosis not present

## 2016-06-28 ENCOUNTER — Other Ambulatory Visit: Payer: Self-pay | Admitting: Family Medicine

## 2016-06-28 DIAGNOSIS — Z1231 Encounter for screening mammogram for malignant neoplasm of breast: Secondary | ICD-10-CM

## 2016-07-01 ENCOUNTER — Ambulatory Visit
Admission: RE | Admit: 2016-07-01 | Discharge: 2016-07-01 | Disposition: A | Discharge status: Home / Self Care | Payer: 59 | Source: Ambulatory Visit | Attending: Family Medicine | Admitting: Family Medicine

## 2016-07-01 DIAGNOSIS — Z1231 Encounter for screening mammogram for malignant neoplasm of breast: Secondary | ICD-10-CM | POA: Diagnosis not present

## 2016-08-03 ENCOUNTER — Ambulatory Visit: Payer: 59 | Admitting: Family Medicine

## 2016-10-13 DIAGNOSIS — Z131 Encounter for screening for diabetes mellitus: Secondary | ICD-10-CM | POA: Diagnosis not present

## 2016-10-13 DIAGNOSIS — Z13220 Encounter for screening for lipoid disorders: Secondary | ICD-10-CM | POA: Diagnosis not present

## 2016-10-13 DIAGNOSIS — Z124 Encounter for screening for malignant neoplasm of cervix: Secondary | ICD-10-CM | POA: Diagnosis not present

## 2016-10-13 DIAGNOSIS — Z136 Encounter for screening for cardiovascular disorders: Secondary | ICD-10-CM | POA: Diagnosis not present

## 2016-10-13 DIAGNOSIS — Z1321 Encounter for screening for nutritional disorder: Secondary | ICD-10-CM | POA: Diagnosis not present

## 2016-10-13 DIAGNOSIS — Z1329 Encounter for screening for other suspected endocrine disorder: Secondary | ICD-10-CM | POA: Diagnosis not present

## 2016-10-13 DIAGNOSIS — Z01419 Encounter for gynecological examination (general) (routine) without abnormal findings: Secondary | ICD-10-CM | POA: Diagnosis not present

## 2016-10-28 DIAGNOSIS — R635 Abnormal weight gain: Secondary | ICD-10-CM | POA: Diagnosis not present

## 2016-11-08 DIAGNOSIS — E782 Mixed hyperlipidemia: Secondary | ICD-10-CM | POA: Diagnosis not present

## 2016-11-22 ENCOUNTER — Ambulatory Visit: Payer: Self-pay | Admitting: Physician Assistant

## 2016-11-22 ENCOUNTER — Encounter: Payer: Self-pay | Admitting: Physician Assistant

## 2016-11-22 VITALS — BP 116/80 | HR 114 | Temp 99.4°F

## 2016-11-22 DIAGNOSIS — J029 Acute pharyngitis, unspecified: Secondary | ICD-10-CM

## 2016-11-22 MED ORDER — AMOXICILLIN 400 MG/5ML PO SUSR: ORAL | 0 refills | Status: DC

## 2016-11-22 MED ORDER — METHYLPREDNISOLONE 4 MG PO TBPK: ORAL_TABLET | ORAL | 0 refills | Status: DC

## 2016-11-22 NOTE — Progress Notes (Signed)
S: A she complains of the sudden onset of sore throat last night with a low-grade, denies cough congestion, chest pain or shortness breath  O: Vitals with low-grade temp of 99, heart rate is increased to 114, ears are normal, throat is red and swollen, neck is supple, no lymphadenopathy noted, lungs are clear to auscultation, heart sounds are normal, patient deferred quick strep test  A acute pharyngitis  P: Amoxil 400 mg per 5 ML's patient is to take 2-1/4 teaspoons twice a day for 10 days, Medrol Dosepak for swelling and inflammation, gargle with warm salt water

## 2016-12-01 DIAGNOSIS — R635 Abnormal weight gain: Secondary | ICD-10-CM | POA: Diagnosis not present

## 2017-01-22 IMAGING — CT CT MAXILLOFACIAL W/O CM
3 series · 16 of 47 positions shown, 19 images · non-contrast
Comparison: None.

CLINICAL DATA: Chronic pansinusitis for 2 - 3 months status post
antibiotics.

EXAM:
CT MAXILLOFACIAL WITHOUT CONTRAST
TECHNIQUE: Multidetector CT imaging of the maxillofacial structures was
performed. Multiplanar CT image reconstructions were also generated.
A small metallic BB was placed on the right temple in order to
reliably differentiate right from left.

[Series 3: ax soft · axial · 0.34mm/px · z∈[-125,-9]mm · 10 of 68 slices shown, 13 images]
[im 5/68  brain]
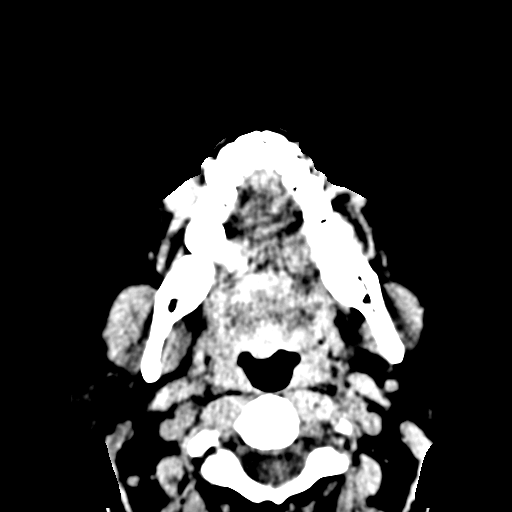
[im 5/68  bone]
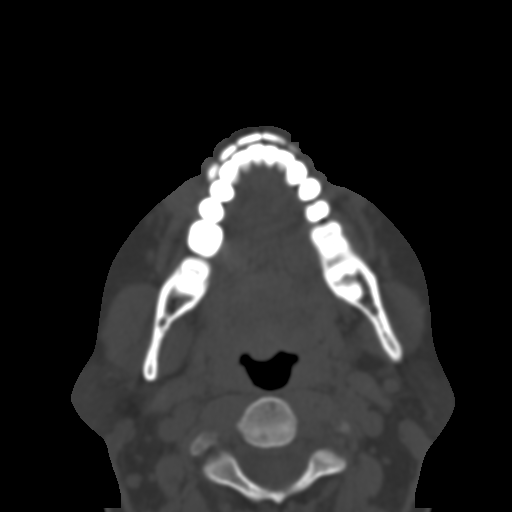
[im 12/68  bone]
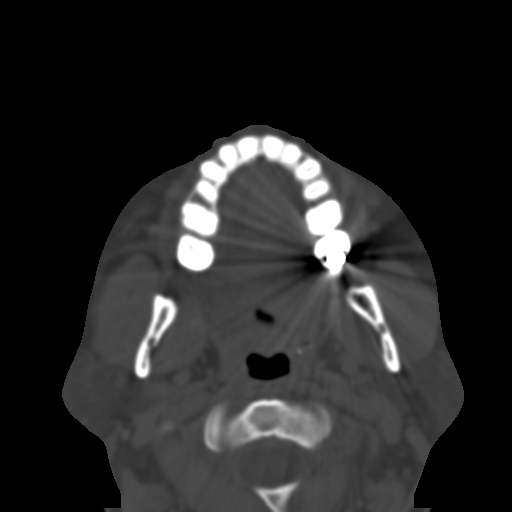
[im 19/68  bone]
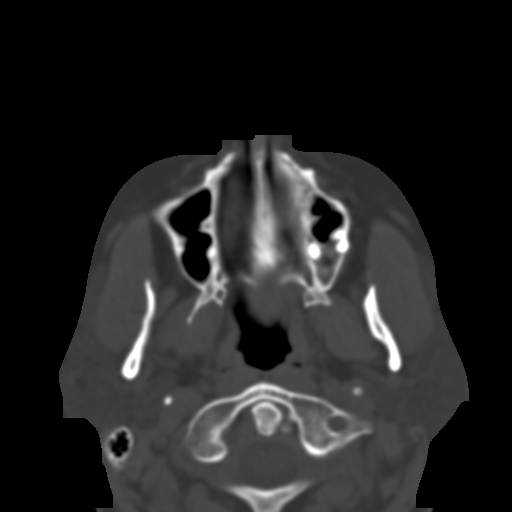
[im 24/68  bone]
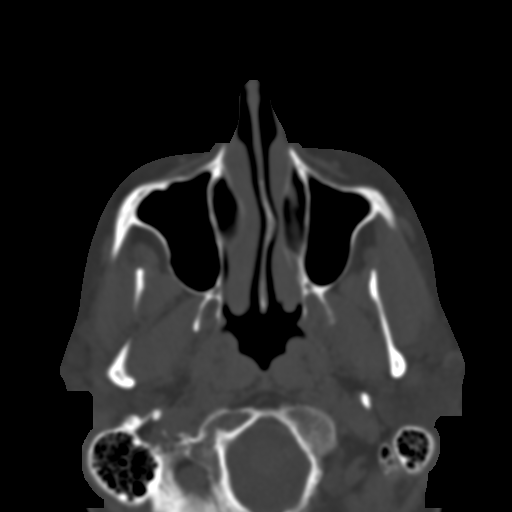
[im 31/68  brain]
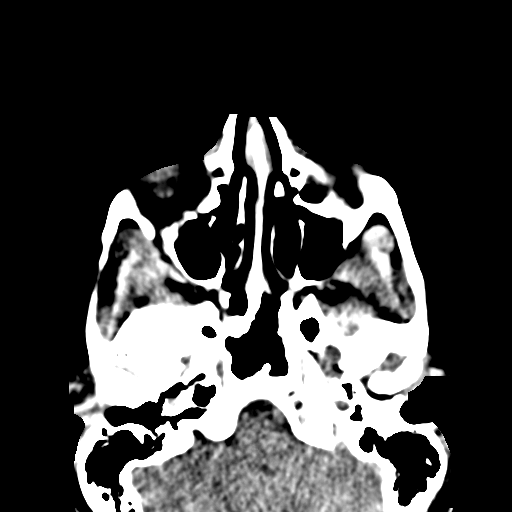
[im 31/68  bone]
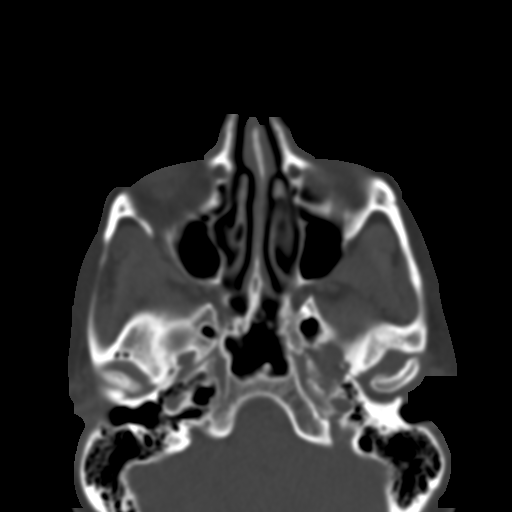
[im 37/68  bone]
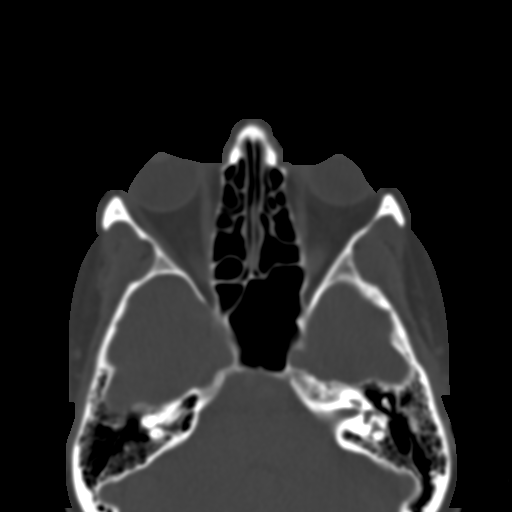
[im 44/68  bone]
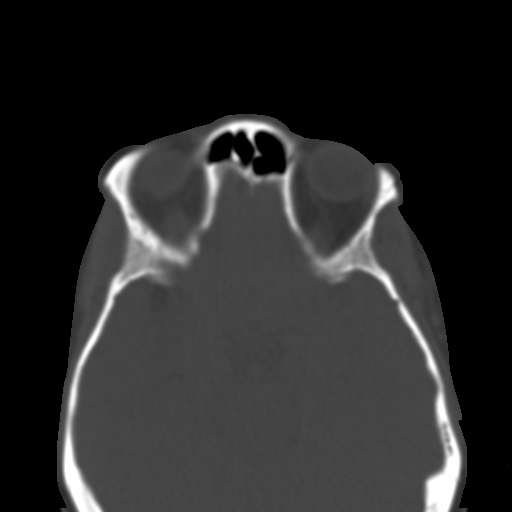
[im 51/68  bone]
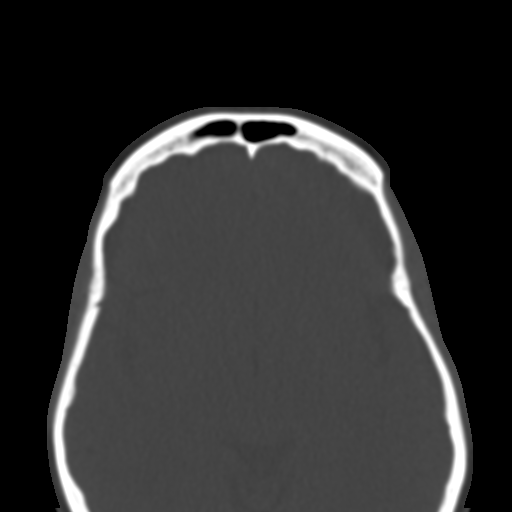
[im 56/68  brain]
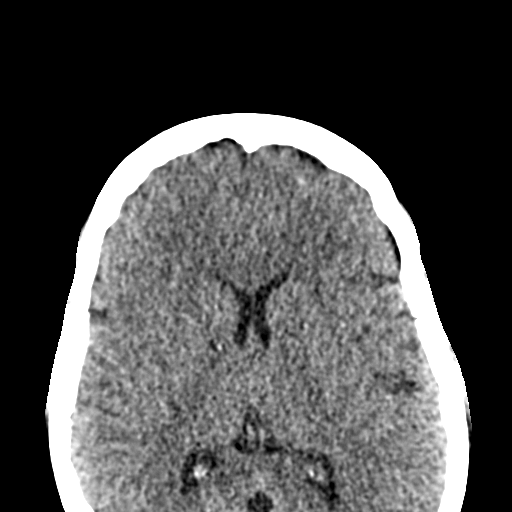
[im 56/68  bone]
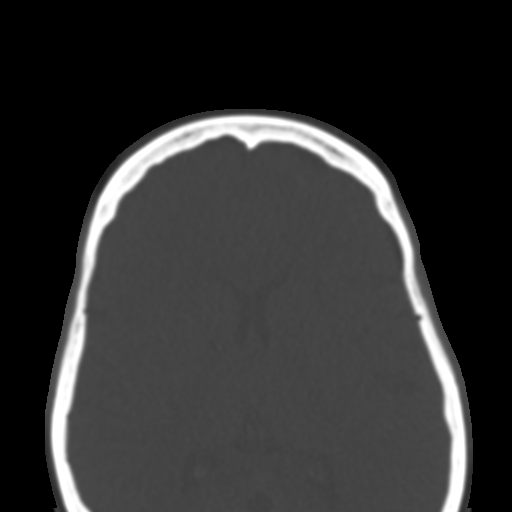
[im 63/68  bone]
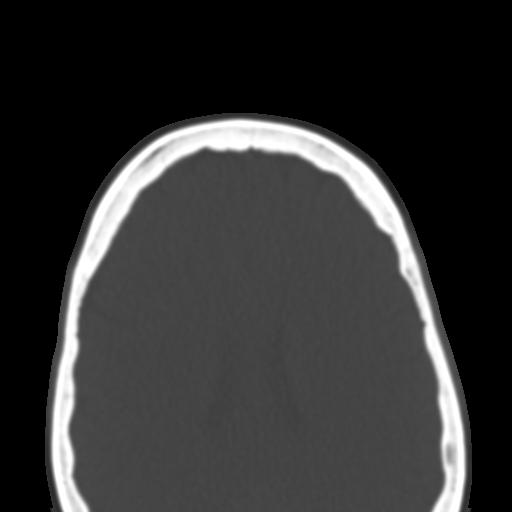

[Series 602: coronal · coronal · 0.34mm/px · 3 of 80 slices shown]
[im 27/80  bone]
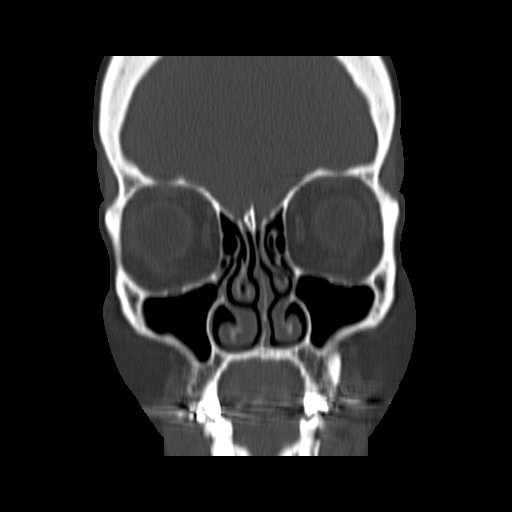
[im 36/80  bone]
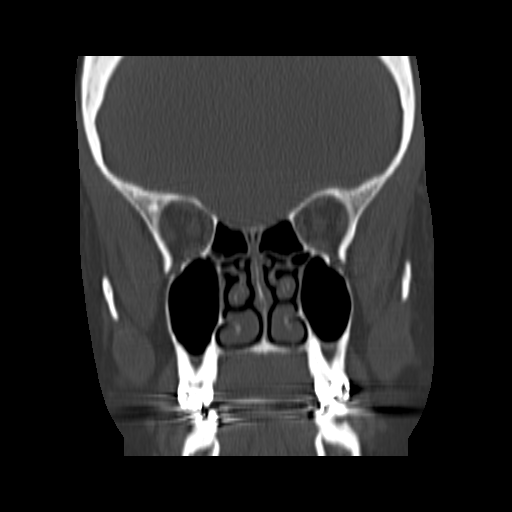
[im 44/80  bone]
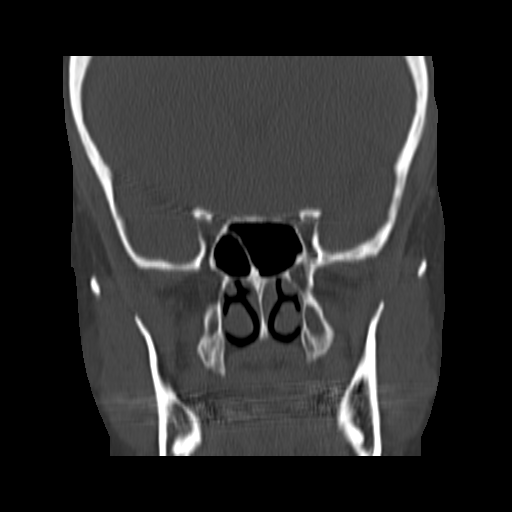

[Series 603: sagittal · sagittal · 0.34mm/px · 3 of 85 slices shown]
[im 29/85  bone]
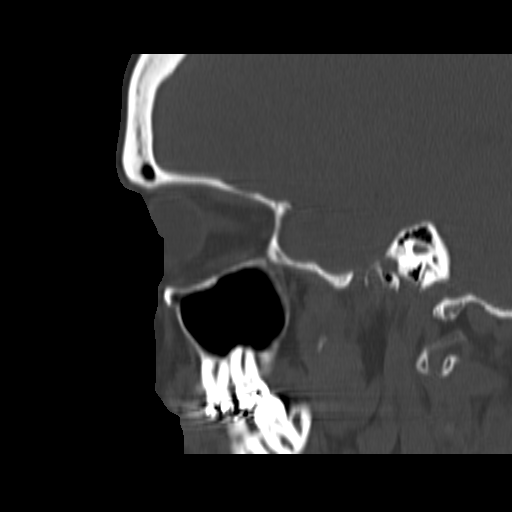
[im 43/85  bone]
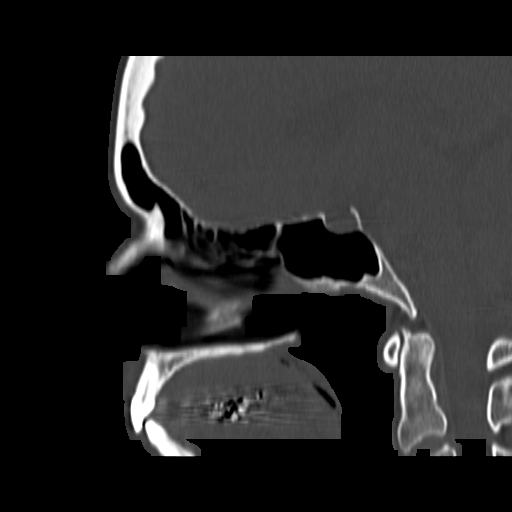
[im 57/85  bone]
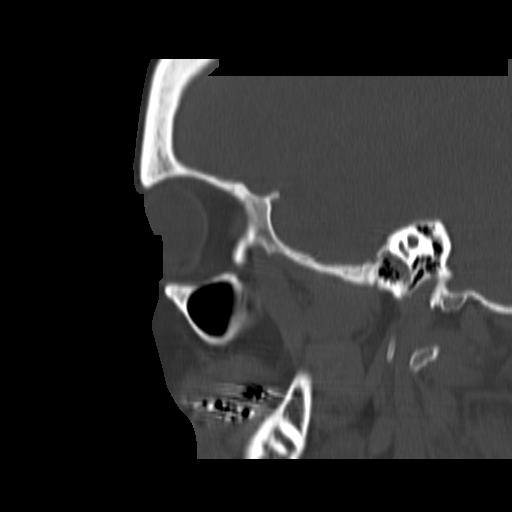

[16 of 47 positions shown; findings below may reference images not displayed]

FINDINGS: Osseous: No fracture or mandibular dislocation. No destructive
process.

Orbits: Negative. No traumatic or inflammatory finding.

Sinuses: Frontal, ethmoid, sphenoid and maxillary sinuses are well
aerated and clear. No mucosal thickening, sinus opacification or
air-fluid level. Negative for sinusitis. Mastoids are also clear.

Soft tissues: Negative.

Limited intracranial: No significant or unexpected finding.
IMPRESSION: No acute finding.  Clear paranasal sinuses.

## 2017-06-06 ENCOUNTER — Encounter: Payer: Self-pay | Admitting: Family Medicine

## 2017-06-06 ENCOUNTER — Ambulatory Visit (INDEPENDENT_AMBULATORY_CARE_PROVIDER_SITE_OTHER): Payer: No Typology Code available for payment source | Admitting: Family Medicine

## 2017-06-06 VITALS — BP 100/60 | HR 74 | Temp 98.1°F | Resp 16 | Wt 158.0 lb

## 2017-06-06 DIAGNOSIS — Z0000 Encounter for general adult medical examination without abnormal findings: Secondary | ICD-10-CM | POA: Diagnosis not present

## 2017-06-06 NOTE — Progress Notes (Signed)
Patient: Leah Burnett, Female    DOB: 02-Oct-1970, 47 y.o.   MRN: [id number] Visit Date: 06/06/2017  Today's Provider: [name], MD   Chief Complaint  Patient presents with  . Annual Exam   Subjective:  Leah Burnett is a 47 y.o. female who presents today for health maintenance and complete physical. She feels well. She reports exercising 3 times weekly. She reports she is sleeping poorly. She has 3 children--ages 19,10,7. 07/01/16 Mammogram-negative Pap-s/p hysterectomy(path report on uterus, cervix, left ovary and tubes)  Review of Systems  Constitutional: Negative.   HENT: Negative.   Eyes: Negative.   Respiratory: Negative.   Cardiovascular: Negative.   Gastrointestinal: Negative.   Endocrine: Positive for heat intolerance.  Genitourinary: Negative.   Musculoskeletal: Negative.   Skin: Negative.   Allergic/Immunologic: Negative.   Neurological: Negative.   Hematological: Negative.   Psychiatric/Behavioral: Negative.     Social History   Socioeconomic History  . Marital status: Married    Spouse name: Not on file  . Number of children: Not on file  . Years of education: Not on file  . Highest education level: Not on file  Occupational History  . Not on file  Social Needs  . Financial resource strain: Not on file  . Food insecurity:    Worry: Not on file    Inability: Not on file  . Transportation needs:    Medical: Not on file    Non-medical: Not on file  Tobacco Use  . Smoking status: Never Smoker  . Smokeless tobacco: Never Used  Substance and Sexual Activity  . Alcohol use: Never    Alcohol/week: 0.0 oz    Frequency: Never    Comment: very rarely  . Drug use: No  . Sexual activity: Not on file  Lifestyle  . Physical activity:    Days per week: Not on file    Minutes per session: Not on file  . Stress: Not on file  Relationships  . Social connections:    Talks on phone: Not on file    Gets together: Not on file    Attends  religious service: Not on file    Active member of club or organization: Not on file    Attends meetings of clubs or organizations: Not on file    Relationship status: Not on file  . Intimate partner violence:    Fear of current or ex partner: Not on file    Emotionally abused: Not on file    Physically abused: Not on file    Forced sexual activity: Not on file  Other Topics Concern  . Not on file  Social History Narrative  . Not on file    Patient Active Problem List   Diagnosis Date Noted  . Hypertension 06/02/2016  . Migraine headache without aura 05/12/2016  . Degeneration of intervertebral disc of lumbar region 01/28/2015  . Neuritis or radiculitis due to rupture of lumbar intervertebral disc 01/28/2015  . Lumbar radiculitis 01/28/2015  . DDD (degenerative disc disease), lumbar 01/28/2015  . Adjustment disorder with anxiety 04/30/2014  . Allergic rhinitis 04/30/2014  . Anxiety 04/30/2014  . Fibrositis 04/30/2014  . Hot flash, menopausal 04/30/2014  . Cannot sleep 04/30/2014  . SVT (supraventricular tachycardia) (HCC) 04/30/2014    Past Surgical History:  Procedure Laterality Date  . ABDOMINAL HYSTERECTOMY    . CHOLECYSTECTOMY  2009  . ovary     right  . OVARY SURGERY  2002    Her family  history includes Anemia in her mother; Cirrhosis in her mother; Heart disease in her father; Hyperlipidemia in her father and mother; Hypertension in her father; Stroke in her father, paternal grandfather, and paternal grandmother.     Outpatient Encounter Medications as of 06/06/2017  Medication Sig Note  . CINNAMON PO Take by mouth.   . fluticasone (FLONASE) 50 MCG/ACT nasal spray Place into both nostrils daily.   . hydrochlorothiazide (HYDRODIURIL) 12.5 MG tablet Take 1 tablet (12.5 mg total) by mouth daily.   Marland Kitchen. KRILL OIL PO Take 2 tablets by mouth daily. 04/30/2014: Received from: [facility] Received Sig:   . phentermine (ADIPEX-P) 37.5 MG tablet    .  propranolol (INDERAL) 40 MG tablet Take 1 tablet (40 mg total) by mouth daily.   . Vitamin D, Ergocalciferol, (DRISDOL) 50000 units CAPS capsule    . [DISCONTINUED] amoxicillin (AMOXIL) 400 MG/5ML suspension Take 2 1/4 tsp bid   . [DISCONTINUED] methylPREDNISolone (MEDROL DOSEPAK) 4 MG TBPK tablet Take 6 pills on day one then decrease by 1 pill each day   . [DISCONTINUED] promethazine (PHENERGAN) 12.5 MG tablet Take 1 tablet (12.5 mg total) by mouth every 6 (six) hours as needed for nausea or vomiting. (Patient not taking: Reported on 11/22/2016)   . [DISCONTINUED] zolpidem (AMBIEN) 10 MG tablet TAKE 1 TABLET BY MOUTH AT BEDTIME AS NEEDED    No facility-administered encounter medications on file as of 06/06/2017.     Patient Care Team: Maple HudsonGilbert, Richard L Jr., MD as PCP - General (Family Medicine)      Objective:   Vitals:  Vitals:   06/06/17 1013  BP: 100/60  Pulse: 74  Resp: 16  Temp: 98.1 F (36.7 C)  TempSrc: Oral  Weight: 158 lb (71.7 kg)    Physical Exam  Constitutional: She is oriented to person, place, and time. She appears well-developed and well-nourished.  HENT:  Head: Normocephalic and atraumatic.  Right Ear: External ear normal.  Left Ear: External ear normal.  Nose: Nose normal.  Mouth/Throat: Oropharynx is clear and moist.  Eyes: Pupils are equal, round, and reactive to light. Conjunctivae and EOM are normal.  Neck: Normal range of motion. Neck supple.  Cardiovascular: Normal rate, regular rhythm, normal heart sounds and intact distal pulses.  Pulmonary/Chest: Effort normal and breath sounds normal.  Abdominal: Soft. Bowel sounds are normal.  Musculoskeletal: Normal range of motion.  Neurological: She is alert and oriented to person, place, and time.  Skin: Skin is warm and dry.  Psychiatric: She has a normal mood and affect. Her behavior is normal. Judgment and thought content normal.     Depression Screen PHQ 2/9 Scores 06/06/2017 06/02/2016  PHQ - 2 Score  0 0  PHQ- 9 Score - 1      Assessment & Plan:     Routine Health Maintenance and Physical Exam  Exercise Activities and Dietary recommendations Goals    None      Immunization History  Administered Date(s) Administered  . H1N1 09/30/2009  . Influenza-Unspecified 10/05/2014    Health Maintenance  Topic Date Due  . HIV Screening  07/15/1983  . TETANUS/TDAP  07/15/1987  . INFLUENZA VACCINE  08/04/2017     Discussed health benefits of physical activity, and encouraged her to engage in regular exercise appropriate for her age and condition. RTC 1 year. Insomnia Off Ambien for more than 6 months. S/p Hysterectomy/left oophorectomy for endometriosis   I have done the exam and reviewed the chart and it is  accurate to the best of my knowledge. Development worker, community has been used and  any errors in dictation or transcription are unintentional. Miguel Aschoff M.D. [facility]

## 2017-06-07 LAB — CBC WITH DIFFERENTIAL/PLATELET
BASOS: 1 %
Basophils Absolute: 0 10*3/uL (ref 0.0–0.2)
EOS (ABSOLUTE): 0.3 10*3/uL (ref 0.0–0.4)
Eos: 5 %
Hematocrit: 41.2 % (ref 34.0–46.6)
Hemoglobin: 14.1 g/dL (ref 11.1–15.9)
IMMATURE GRANULOCYTES: 0 %
Immature Grans (Abs): 0 10*3/uL (ref 0.0–0.1)
LYMPHS ABS: 2.2 10*3/uL (ref 0.7–3.1)
Lymphs: 37 %
MCH: 28.5 pg (ref 26.6–33.0)
MCHC: 34.2 g/dL (ref 31.5–35.7)
MCV: 83 fL (ref 79–97)
Monocytes Absolute: 0.5 10*3/uL (ref 0.1–0.9)
Monocytes: 8 %
NEUTROS PCT: 49 %
Neutrophils Absolute: 2.9 10*3/uL (ref 1.4–7.0)
PLATELETS: 378 10*3/uL (ref 150–450)
RBC: 4.94 x10E6/uL (ref 3.77–5.28)
RDW: 14 % (ref 12.3–15.4)
WBC: 6 10*3/uL (ref 3.4–10.8)

## 2017-06-07 LAB — COMPREHENSIVE METABOLIC PANEL
A/G RATIO: 1.8 (ref 1.2–2.2)
ALK PHOS: 118 IU/L — AB (ref 39–117)
ALT: 20 IU/L (ref 0–32)
AST: 23 IU/L (ref 0–40)
Albumin: 4.8 g/dL (ref 3.5–5.5)
BILIRUBIN TOTAL: 0.5 mg/dL (ref 0.0–1.2)
BUN/Creatinine Ratio: 12 (ref 9–23)
BUN: 12 mg/dL (ref 6–24)
CALCIUM: 10 mg/dL (ref 8.7–10.2)
CHLORIDE: 100 mmol/L (ref 96–106)
CO2: 26 mmol/L (ref 20–29)
Creatinine, Ser: 1 mg/dL (ref 0.57–1.00)
GFR calc Af Amer: 77 mL/min/{1.73_m2} (ref >59)
GFR, EST NON AFRICAN AMERICAN: 67 mL/min/{1.73_m2} (ref >59)
Globulin, Total: 2.6 g/dL (ref 1.5–4.5)
Glucose: 83 mg/dL (ref 65–99)
POTASSIUM: 3.6 mmol/L (ref 3.5–5.2)
SODIUM: 144 mmol/L (ref 134–144)
Total Protein: 7.4 g/dL (ref 6.0–8.5)

## 2017-06-07 LAB — LIPID PANEL WITH LDL/HDL RATIO
Cholesterol, Total: 196 mg/dL (ref 100–199)
HDL: 40 mg/dL (ref >39)
LDL Calculated: 139 mg/dL — ABNORMAL HIGH (ref 0–99)
LDl/HDL Ratio: 3.5 ratio — ABNORMAL HIGH (ref 0.0–3.2)
TRIGLYCERIDES: 83 mg/dL (ref 0–149)
VLDL Cholesterol Cal: 17 mg/dL (ref 5–40)

## 2017-06-07 LAB — TSH: TSH: 1.69 u[IU]/mL (ref 0.450–4.500)

## 2017-06-08 ENCOUNTER — Telehealth: Payer: Self-pay

## 2017-06-08 NOTE — Telephone Encounter (Signed)
LMTCB 06/08/2017  Thanks,   -Kaidyn Javid  

## 2017-06-08 NOTE — Telephone Encounter (Signed)
-----   Message from Maple Hudsonichard L Gilbert Jr., MD sent at 06/08/2017  1:43 PM EDT ----- Labs OK

## 2017-06-10 NOTE — Telephone Encounter (Signed)
Advised  ED 

## 2017-07-04 ENCOUNTER — Ambulatory Visit (INDEPENDENT_AMBULATORY_CARE_PROVIDER_SITE_OTHER): Payer: No Typology Code available for payment source | Admitting: Family Medicine

## 2017-07-04 ENCOUNTER — Encounter: Payer: Self-pay | Admitting: Family Medicine

## 2017-07-04 ENCOUNTER — Telehealth: Payer: Self-pay

## 2017-07-04 VITALS — BP 98/62 | HR 92 | Temp 98.7°F | Resp 16 | Ht 64.0 in | Wt 160.0 lb

## 2017-07-04 DIAGNOSIS — R319 Hematuria, unspecified: Secondary | ICD-10-CM | POA: Diagnosis not present

## 2017-07-04 DIAGNOSIS — R5383 Other fatigue: Secondary | ICD-10-CM

## 2017-07-04 DIAGNOSIS — R109 Unspecified abdominal pain: Secondary | ICD-10-CM

## 2017-07-04 DIAGNOSIS — R55 Syncope and collapse: Secondary | ICD-10-CM | POA: Diagnosis not present

## 2017-07-04 DIAGNOSIS — N390 Urinary tract infection, site not specified: Secondary | ICD-10-CM

## 2017-07-04 LAB — POCT URINALYSIS DIPSTICK
BILIRUBIN UA: NEGATIVE
GLUCOSE UA: NEGATIVE
Ketones, UA: NEGATIVE
NITRITE UA: NEGATIVE
Protein, UA: POSITIVE — AB
Spec Grav, UA: 1.02 (ref 1.010–1.025)
Urobilinogen, UA: 0.2 E.U./dL
pH, UA: 6 (ref 5.0–8.0)

## 2017-07-04 MED ORDER — PROMETHAZINE HCL 25 MG PO TABS: 25.0000 mg | ORAL_TABLET | Freq: Four times a day (QID) | ORAL | 0 refills | Status: DC | PRN

## 2017-07-04 MED ORDER — HYDROCODONE-ACETAMINOPHEN 5-325 MG PO TABS: 1.0000 | ORAL_TABLET | ORAL | 0 refills | Status: DC | PRN

## 2017-07-04 MED ORDER — SULFAMETHOXAZOLE-TRIMETHOPRIM 800-160 MG PO TABS: 1.0000 | ORAL_TABLET | Freq: Two times a day (BID) | ORAL | 0 refills | Status: AC

## 2017-07-04 NOTE — Progress Notes (Signed)
Patient: Leah ItoLeslie S Jakubek Female    DOB: 10/04/1970   47 y.o.   MRN: [id number] Visit Date: 07/04/2017  Today's Provider: Megan Mansichard Gilbert Jr, MD   Chief Complaint  Patient presents with  . Near Syncope   Subjective:    HPI Patient comes in today c/o near sycnope that happened around 2am this morning. She reports that she never had this happened before. She woke up to use the bathroom, and all of a sudden she became dizzy and light headed. She reports that the sensation lasted for about 20mins, and then the next thing she remembers is waking up on the floor.  No seizure,no postictal state,no associated symptoms of chest pain ,dyspnea,leg swelling. No recent travel. Husband says she wass out for a few seconds.     No Known Allergies   Current Outpatient Medications:  .  CINNAMON PO, Take by mouth., Disp: , Rfl:  .  fluticasone (FLONASE) 50 MCG/ACT nasal spray, Place into both nostrils daily., Disp: , Rfl:  .  hydrochlorothiazide (HYDRODIURIL) 12.5 MG tablet, Take 1 tablet (12.5 mg total) by mouth daily., Disp: 30 tablet, Rfl: 12 .  KRILL OIL PO, Take 2 tablets by mouth daily., Disp: , Rfl:  .  phentermine (ADIPEX-P) 37.5 MG tablet, , Disp: , Rfl: 0 .  propranolol (INDERAL) 40 MG tablet, Take 1 tablet (40 mg total) by mouth daily. (Patient not taking: Reported on 07/04/2017), Disp: 30 tablet, Rfl: 2 .  Vitamin D, Ergocalciferol, (DRISDOL) 50000 units CAPS capsule, , Disp: , Rfl: 3  Review of Systems  Constitutional: Positive for activity change and fatigue.       Pt felt very hot/flushed during episode.  HENT: Negative.   Eyes: Negative.   Respiratory: Negative.   Cardiovascular: Negative.   Endocrine: Negative.   Genitourinary: Positive for flank pain.       Mild flank pain on left since last night.  Musculoskeletal: Positive for myalgias.       Mainly on left side.   Allergic/Immunologic: Negative.   Neurological: Positive for dizziness, weakness and light-headedness.  Negative for tremors, facial asymmetry, speech difficulty, numbness and headaches.  Hematological: Negative.   Psychiatric/Behavioral: Negative.     Social History   Tobacco Use  . Smoking status: Never Smoker  . Smokeless tobacco: Never Used  Substance Use Topics  . Alcohol use: Never    Alcohol/week: 0.0 oz    Frequency: Never    Comment: very rarely   Objective:   BP 98/62 (BP Location: Right Arm, Patient Position: Sitting, Cuff Size: Normal)   Pulse 92   Temp 98.7 F (37.1 C)   Resp 16   Ht 5\' 4"  (1.626 m)   Wt 160 lb (72.6 kg)   SpO2 98%   BMI 27.46 kg/m  Vitals:   07/04/17 1108  BP: 98/62  Pulse: 92  Resp: 16  Temp: 98.7 F (37.1 C)  SpO2: 98%  Weight: 160 lb (72.6 kg)  Height: 5\' 4"  (1.626 m)     Physical Exam  Constitutional: She is oriented to person, place, and time. She appears well-developed and well-nourished.  HENT:  Head: Normocephalic and atraumatic.  Right Ear: External ear normal.  Left Ear: External ear normal.  Nose: Nose normal.  Mouth/Throat: Oropharynx is clear and moist.  Eyes: Pupils are equal, round, and reactive to light. Conjunctivae and EOM are normal. No scleral icterus.  Neck: Thyromegaly present.  Cardiovascular: Normal rate, regular rhythm and normal heart  sounds.  Pulmonary/Chest: Effort normal and breath sounds normal.  Abdominal: Soft.  Musculoskeletal: She exhibits no edema.  Lymphadenopathy:    She has no cervical adenopathy.  Neurological: She is alert and oriented to person, place, and time.  Skin: Skin is warm and dry.  Psychiatric: She has a normal mood and affect. Her behavior is normal. Judgment and thought content normal.        Assessment & Plan:     1. Near syncope/Vasovagal syncope RTC 2 days.May need cardiology evaluation. No s/s of PE. - EKG 12-Lead - promethazine (PHENERGAN) 25 MG tablet; Take 1 tablet (25 mg total) by mouth every 6 (six) hours as needed for nausea or vomiting.  Dispense: 25 tablet;  Refill: 0  2. Urinary tract infection with hematuria, site unspecified  - Urine Culture - POCT urinalysis dipstick - sulfamethoxazole-trimethoprim (BACTRIM DS,SEPTRA DS) 800-160 MG tablet; Take 1 tablet by mouth 2 (two) times daily for 5 days.  Dispense: 10 tablet; Refill: 0  3. Other fatigue  - CBC with Differential/Platelet - Comprehensive metabolic panel - TSH  4. Flank pain/possible left renal stone  - HYDROcodone-acetaminophen (NORCO/VICODIN) 5-325 MG tablet; Take 1 tablet by mouth every 4 (four) hours as needed for moderate pain.  Dispense: 25 tablet; Refill: 0      I have done the exam and reviewed the above chart and it is accurate to the best of my knowledge. Dentist has been used in this note in any air is in the dictation or transcription are unintentional.  [name], MD  [facility]

## 2017-07-04 NOTE — Telephone Encounter (Signed)
FYI...  Pt called reporting she past out early this morning (1 or 2 am).  She states she woke up only feeling "Very hot."  She denies any other symptoms.  She says that she got up to go to the bathroom and was not able to go.  As she heading back to the bedroom is when she past out.  Her husband was there.  She feels better this morning but not 100%.  She has an appointment today with you at 11AM, and she states her husband will bring her in.    I advised her if she has new or worsening symptoms that she should go to the ER.  She agreed.   Thanks,   -Vernona RiegerLaura

## 2017-07-05 LAB — CBC WITH DIFFERENTIAL/PLATELET
BASOS: 0 %
Basophils Absolute: 0 10*3/uL (ref 0.0–0.2)
EOS (ABSOLUTE): 0.1 10*3/uL (ref 0.0–0.4)
Eos: 1 %
Hematocrit: 42.1 % (ref 34.0–46.6)
Hemoglobin: 14.3 g/dL (ref 11.1–15.9)
IMMATURE GRANULOCYTES: 0 %
Immature Grans (Abs): 0 10*3/uL (ref 0.0–0.1)
Lymphocytes Absolute: 2.1 10*3/uL (ref 0.7–3.1)
Lymphs: 29 %
MCH: 28.5 pg (ref 26.6–33.0)
MCHC: 34 g/dL (ref 31.5–35.7)
MCV: 84 fL (ref 79–97)
MONOS ABS: 0.6 10*3/uL (ref 0.1–0.9)
Monocytes: 8 %
NEUTROS PCT: 62 %
Neutrophils Absolute: 4.6 10*3/uL (ref 1.4–7.0)
PLATELETS: 407 10*3/uL (ref 150–450)
RBC: 5.01 x10E6/uL (ref 3.77–5.28)
RDW: 14.2 % (ref 12.3–15.4)
WBC: 7.3 10*3/uL (ref 3.4–10.8)

## 2017-07-05 LAB — COMPREHENSIVE METABOLIC PANEL
ALT: 45 IU/L — ABNORMAL HIGH (ref 0–32)
AST: 36 IU/L (ref 0–40)
Albumin/Globulin Ratio: 1.7 (ref 1.2–2.2)
Albumin: 4.8 g/dL (ref 3.5–5.5)
Alkaline Phosphatase: 135 IU/L — ABNORMAL HIGH (ref 39–117)
BILIRUBIN TOTAL: 0.5 mg/dL (ref 0.0–1.2)
BUN / CREAT RATIO: 15 (ref 9–23)
BUN: 13 mg/dL (ref 6–24)
CALCIUM: 10 mg/dL (ref 8.7–10.2)
CO2: 27 mmol/L (ref 20–29)
CREATININE: 0.88 mg/dL (ref 0.57–1.00)
Chloride: 97 mmol/L (ref 96–106)
GFR, EST AFRICAN AMERICAN: 90 mL/min/{1.73_m2} (ref >59)
GFR, EST NON AFRICAN AMERICAN: 78 mL/min/{1.73_m2} (ref >59)
Globulin, Total: 2.9 g/dL (ref 1.5–4.5)
Glucose: 101 mg/dL — ABNORMAL HIGH (ref 65–99)
Potassium: 4 mmol/L (ref 3.5–5.2)
Sodium: 140 mmol/L (ref 134–144)
Total Protein: 7.7 g/dL (ref 6.0–8.5)

## 2017-07-05 LAB — TSH: TSH: 1.4 u[IU]/mL (ref 0.450–4.500)

## 2017-07-06 ENCOUNTER — Ambulatory Visit: Payer: Self-pay | Admitting: Family Medicine

## 2017-07-06 LAB — URINE CULTURE: ORGANISM ID, BACTERIA: NO GROWTH

## 2017-07-27 ENCOUNTER — Ambulatory Visit (INDEPENDENT_AMBULATORY_CARE_PROVIDER_SITE_OTHER): Payer: Self-pay | Admitting: Family Medicine

## 2017-07-27 ENCOUNTER — Encounter: Payer: Self-pay | Admitting: Family Medicine

## 2017-07-27 VITALS — BP 132/90 | HR 92 | Temp 98.2°F | Wt 159.0 lb

## 2017-07-27 DIAGNOSIS — B353 Tinea pedis: Secondary | ICD-10-CM

## 2017-07-27 DIAGNOSIS — L089 Local infection of the skin and subcutaneous tissue, unspecified: Secondary | ICD-10-CM

## 2017-07-27 MED ORDER — DOXYCYCLINE HYCLATE 100 MG PO CAPS: 100.0000 mg | ORAL_CAPSULE | Freq: Two times a day (BID) | ORAL | 0 refills | Status: DC

## 2017-07-27 MED ORDER — CLOTRIMAZOLE 1 % EX CREA: 1.0000 "application " | TOPICAL_CREAM | Freq: Two times a day (BID) | CUTANEOUS | 0 refills | Status: AC

## 2017-07-27 NOTE — Patient Instructions (Addendum)
- You are being treated for a skin infection with Doxycyline 100 mg twice daily x 10 days.   -Apply lotrimin ointment between the two of the left foot twice daily x 14 days once you complete oral antibiotics.    Athlete's Foot Athlete's foot (tinea pedis) is a fungal infection of the skin on the feet. It often occurs on the skin that is between or underneath the toes. It can also occur on the soles of the feet. The infection can spread from person to person (is contagious). Follow these instructions at home:  Apply or take over-the-counter and prescription medicines only as told by your doctor.  Keep all follow-up visits as told by your doctor. This is important.  Do not scratch your feet.  Keep your feet dry: ? Wear cotton or wool socks. Change your socks every day or if they become wet. ? Wear shoes that allow air to move around, such as sandals or canvas tennis shoes.  Wash and dry your feet: ? Every day or as told by your doctor. ? After exercising. ? Including the area between your toes.  Wear sandals in wet areas, such as locker rooms and shared showers.  Do not share any of these items: ? Towels. ? Nail clippers. ? Other personal items that touch your feet.  If you have diabetes, keep your blood sugar under control. Contact a doctor if:  You have a fever.  You have swelling, soreness, warmth, or redness in your foot.  You are not getting better with treatment.  Your symptoms get worse.  You have new symptoms. This information is not intended to replace advice given to you by your health care provider. Make sure you discuss any questions you have with your health care provider. Document Released: 06/09/2007 Document Revised: 05/29/2015 Document Reviewed: 06/24/2014 Elsevier Interactive Patient Education  2018 [employer].   Skin Yeast Infection Skin yeast infection is a condition in which there is an overgrowth of yeast (candida) that normally lives on the  skin. This condition usually occurs in areas of the skin that are constantly warm and moist, such as the armpits or the groin. What are the causes? This condition is caused by a change in the normal balance of the yeast and bacteria that live on the skin. What increases the risk? This condition is more likely to develop in:  People who are obese.  Pregnant women.  Women who take birth control pills.  People who have diabetes.  People who take antibiotic medicines.  People who take steroid medicines.  People who are malnourished.  People who have a weak defense (immune) system.  People who are 58 years of age or older.  What are the signs or symptoms? Symptoms of this condition include:  A red, swollen area of the skin.  Bumps on the skin.  Itchiness.  How is this diagnosed? This condition is diagnosed with a medical history and physical exam. Your health care provider may check for yeast by taking light scrapings of the skin to be viewed under a microscope. How is this treated? This condition is treated with medicine. Medicines may be prescribed or be available over-the-counter. The medicines may be:  Taken by mouth (orally).  Applied as a cream.  Follow these instructions at home:  Take or apply over-the-counter and prescription medicines only as told by your health care provider.  Eat more yogurt. This may help to keep your yeast infection from returning.  Maintain a healthy weight.  If you need help losing weight, talk with your health care provider.  Keep your skin clean and dry.  If you have diabetes, keep your blood sugar under control. Contact a health care provider if:  Your symptoms go away and then return.  Your symptoms do not get better with treatment.  Your symptoms get worse.  Your rash spreads.  You have a fever or chills.  You have new symptoms.  You have new warmth or redness of your skin. This information is not intended to replace  advice given to you by your health care provider. Make sure you discuss any questions you have with your health care provider. Document Released: 09/08/2010 Document Revised: 08/17/2015 Document Reviewed: 06/24/2014 Elsevier Interactive Patient Education  [employer]2018 Elsevier Inc.

## 2017-07-27 NOTE — Progress Notes (Signed)
Patient ID: Leah Burnett, female    DOB: [birth year], 47 y.o.   MRN: [id number]  PCP: Maple HudsonGilbert, Richard L Jr., MD  Chief Complaint  Patient presents with  . Cellulitis    red streak    Subjective:  HPI Leah Burnett is a 47 y.o. female presents for evaluation of two days of foot pain and redness of right toe (#4 and #5). Uncertain if she was bitten by an insect. Felt pain between her 3rd and 4th toe two nights prior which she describes as the sensation of "someone flossing between her toes". No history of tinea. Concern today as she noticed red streak extending from between right toe (#4 and #5) toward her ankle. Denies fever, chills, nausea, or vomiting. Social History   Socioeconomic History  . Marital status: Married    Spouse name: Not on file  . Number of children: Not on file  . Years of education: Not on file  . Highest education level: Not on file  Occupational History  . Not on file  Social Needs  . Financial resource strain: Not on file  . Food insecurity:    Worry: Not on file    Inability: Not on file  . Transportation needs:    Medical: Not on file    Non-medical: Not on file  Tobacco Use  . Smoking status: Never Smoker  . Smokeless tobacco: Never Used  Substance and Sexual Activity  . Alcohol use: Never    Alcohol/week: 0.0 oz    Frequency: Never    Comment: very rarely  . Drug use: No  . Sexual activity: Not on file  Lifestyle  . Physical activity:    Days per week: Not on file    Minutes per session: Not on file  . Stress: Not on file  Relationships  . Social connections:    Talks on phone: Not on file    Gets together: Not on file    Attends religious service: Not on file    Active member of club or organization: Not on file    Attends meetings of clubs or organizations: Not on file    Relationship status: Not on file  . Intimate partner violence:    Fear of current or ex partner: Not on file    Emotionally abused: Not on file   Physically abused: Not on file    Forced sexual activity: Not on file  Other Topics Concern  . Not on file  Social History Narrative  . Not on file    Family History  Problem Relation Age of Onset  . Hyperlipidemia Mother   . Anemia Mother   . Cirrhosis Mother   . Hypertension Father   . Heart disease Father        MI with bypass  . Hyperlipidemia Father   . Stroke Father   . Stroke Paternal Grandmother   . Stroke Paternal Grandfather   . Breast cancer Neg Hx    Review of Systems Pertinent negatives listed in HPI Patient Active Problem List   Diagnosis Date Noted  . Hypertension 06/02/2016  . Migraine headache without aura 05/12/2016  . Degeneration of intervertebral disc of lumbar region 01/28/2015  . Neuritis or radiculitis due to rupture of lumbar intervertebral disc 01/28/2015  . Lumbar radiculitis 01/28/2015  . DDD (degenerative disc disease), lumbar 01/28/2015  . Adjustment disorder with anxiety 04/30/2014  . Allergic rhinitis 04/30/2014  . Anxiety 04/30/2014  . Fibrositis 04/30/2014  . Hot flash, menopausal  04/30/2014  . Cannot sleep 04/30/2014  . SVT (supraventricular tachycardia) (HCC) 04/30/2014    No Known Allergies  Prior to Admission medications   Medication Sig Start Date End Date Taking? Authorizing Provider  fluticasone (FLONASE) 50 MCG/ACT nasal spray Place into both nostrils daily.   Yes [provider]  hydrochlorothiazide (HYDRODIURIL) 12.5 MG tablet Take 1 tablet (12.5 mg total) by mouth daily. 06/02/16  Yes Maple Hudson., MD  phentermine (ADIPEX-P) 37.5 MG tablet  11/10/16  Yes [provider]  CINNAMON PO Take by mouth.    [provider]  HYDROcodone-acetaminophen (NORCO/VICODIN) 5-325 MG tablet Take 1 tablet by mouth every 4 (four) hours as needed for moderate pain. Patient not taking: Reported on 07/27/2017 07/04/17   Maple Hudson., MD  KRILL OIL PO Take 2 tablets by mouth daily.    [provider]  promethazine (PHENERGAN) 25 MG tablet Take 1 tablet (25 mg total) by mouth every 6 (six) hours as needed for nausea or vomiting. 07/04/17   Maple Hudson., MD  propranolol (INDERAL) 40 MG tablet Take 1 tablet (40 mg total) by mouth daily. Patient not taking: Reported on 07/04/2017 05/12/16   Trey Sailors, PA-C  Vitamin D, Ergocalciferol, (DRISDOL) 50000 units CAPS capsule  11/03/16   [provider]    Past Medical, Surgical Family and Social History reviewed and updated.    Objective:   Today's Vitals   07/27/17 1002  BP: 132/90  Pulse: 92  Temp: 98.2 F (36.8 C)  SpO2: 98%  Weight: 159 lb (72.1 kg)  PainSc: 8   PainLoc: Foot    Wt Readings from Last 3 Encounters:  07/27/17 159 lb (72.1 kg)  07/04/17 160 lb (72.6 kg)  06/06/17 158 lb (71.7 kg)   Physical Exam  Constitutional: She appears well-developed and well-nourished.  Cardiovascular: Normal rate.  Pulses:      Posterior tibial pulses are 2+ on the right side.  Pulmonary/Chest: Effort normal.  Musculoskeletal:       Feet:  Skin: Skin is warm. Lesion noted. There is erythema.  Dry, excoriated, annular type scaly lesion affix between all digits of right foot. Erythema present  Between toes 4th and 5 most prominently.  Fissure present between  4th and 5th right toes. Left foot and toes unremarkable.      Assessment & Plan:  1. Skin infection, will treat with a broad spectrum antibiotic as secondary infection  likely developed due to underlying fungal infection resulted in fissuring of the skin between toe 4 and 5 of the right foot. Will treat skin infection with Doxycyline 100 mg twice daily x 10 days.  2. Tinea pedis of left foot, will treat with a topical antifungal ointment once skin infection as resolved. Recommended Lotrimin twice daily for 14 days between the toes of the left foot. May repeat use at the first sign fungal infection recurring.   If symptoms worsen or do not  improve, return for follow-up, follow-up with PCP, or at the emergency department if severity of symptoms warrant a higher level of care.     Godfrey Pick. Tiburcio Pea, MSN, FNP-C [facility]  [address]  [place], [place] [address] [phone]

## 2017-08-01 ENCOUNTER — Other Ambulatory Visit: Payer: Self-pay | Admitting: Family Medicine

## 2017-08-01 DIAGNOSIS — Z1231 Encounter for screening mammogram for malignant neoplasm of breast: Secondary | ICD-10-CM

## 2017-08-05 ENCOUNTER — Ambulatory Visit
Admission: RE | Admit: 2017-08-05 | Discharge: 2017-08-05 | Disposition: A | Discharge status: Home / Self Care | Payer: No Typology Code available for payment source | Source: Ambulatory Visit | Attending: Family Medicine | Admitting: Family Medicine

## 2017-08-05 DIAGNOSIS — Z1231 Encounter for screening mammogram for malignant neoplasm of breast: Secondary | ICD-10-CM | POA: Insufficient documentation

## 2017-12-30 ENCOUNTER — Other Ambulatory Visit: Payer: Self-pay

## 2017-12-30 ENCOUNTER — Encounter: Payer: Self-pay | Admitting: Family Medicine

## 2017-12-30 ENCOUNTER — Ambulatory Visit (INDEPENDENT_AMBULATORY_CARE_PROVIDER_SITE_OTHER): Payer: No Typology Code available for payment source | Admitting: Family Medicine

## 2017-12-30 VITALS — BP 150/88 | HR 105 | Temp 98.3°F | Ht 65.0 in | Wt 178.6 lb

## 2017-12-30 DIAGNOSIS — J069 Acute upper respiratory infection, unspecified: Secondary | ICD-10-CM

## 2017-12-30 NOTE — Progress Notes (Signed)
  Subjective:     Patient ID: Leah Burnett, female   DOB: Aug 20, 1970, 47 y.o.   MRN: [id number] Chief Complaint  Patient presents with  . Sinusitis    possible.  since wed 12/28/17 having sinus presure and neck feels swollen, ear pain and skin hurts.  lots of congestion no mucus coming up, sneezing and sometimes blows out yellow mucus, no fever   HPI Reports onset 12/24 of sneezing, sinus congestion, right sided sinus pressure, body aches, and PND but no fever. Has been using Nyquil for her sx.  Review of Systems     Objective:   Physical Exam Constitutional:      General: She is not in acute distress.    Appearance: Normal appearance. She is not ill-appearing.   Ears: T.M's intact without inflammation Sinuses: mild right maxillary sinus tenderness Throat: no tonsillar enlargement or exudate Neck: no cervical adenopathy Lungs: clear     Assessment:    1. URI, acute     Plan:    Discussed use of saline irrigation (sample), Delsym and robitussin. Continue Nyquil at bedtime for PND. Call 12/30 if sinuses not improving.

## 2017-12-30 NOTE — Patient Instructions (Signed)
Discussed use of Saline irrigation, Nyquil, and Delsym for cough. Try Robitussin as an expectorant. Call me Monday if sinuses not improving.

## 2018-02-09 ENCOUNTER — Ambulatory Visit (INDEPENDENT_AMBULATORY_CARE_PROVIDER_SITE_OTHER): Payer: No Typology Code available for payment source | Admitting: Family Medicine

## 2018-02-09 ENCOUNTER — Encounter: Payer: Self-pay | Admitting: Family Medicine

## 2018-02-09 VITALS — BP 104/72 | Temp 98.2°F | Resp 16 | Ht 65.5 in | Wt 173.0 lb

## 2018-02-09 DIAGNOSIS — L709 Acne, unspecified: Secondary | ICD-10-CM | POA: Diagnosis not present

## 2018-02-09 DIAGNOSIS — L719 Rosacea, unspecified: Secondary | ICD-10-CM

## 2018-02-09 MED ORDER — DOXYCYCLINE HYCLATE 100 MG PO TABS: 100.0000 mg | ORAL_TABLET | Freq: Two times a day (BID) | ORAL | 0 refills | Status: DC

## 2018-02-09 NOTE — Progress Notes (Signed)
Patient: Leah Burnett Female    DOB: August 16, 1970   48 y.o.   MRN: [id number] Visit Date: 02/09/2018  Today's Provider: [name], MD   Chief Complaint  Patient presents with  . Rash   Subjective:     HPI  Patient reports that she needs a referral to a dermatologist for chronic rash. She reports that her new insurance plan will need this.   Patient is also wanting to start back on phentermine. She is trying to lose weight.  Wt Readings from Last 3 Encounters:  02/09/18 173 lb (78.5 kg)  12/30/17 178 lb 9.6 oz (81 kg)  07/27/17 159 lb (72.1 kg)   No Known Allergies   Current Outpatient Medications:  .  CINNAMON PO, Take by mouth., Disp: , Rfl:  .  fluticasone (FLONASE) 50 MCG/ACT nasal spray, Place into both nostrils daily., Disp: , Rfl:  .  hydrochlorothiazide (HYDRODIURIL) 12.5 MG tablet, Take 1 tablet (12.5 mg total) by mouth daily., Disp: 30 tablet, Rfl: 12 .  KRILL OIL PO, Take 2 tablets by mouth daily., Disp: , Rfl:  .  Vitamin D, Ergocalciferol, (DRISDOL) 50000 units CAPS capsule, , Disp: , Rfl: 3 .  doxycycline (VIBRAMYCIN) 100 MG capsule, Take 1 capsule (100 mg total) by mouth 2 (two) times daily. (Patient not taking: Reported on 12/30/2017), Disp: 20 capsule, Rfl: 0 .  HYDROcodone-acetaminophen (NORCO/VICODIN) 5-325 MG tablet, Take 1 tablet by mouth every 4 (four) hours as needed for moderate pain. (Patient not taking: Reported on 12/30/2017), Disp: 25 tablet, Rfl: 0 .  phentermine (ADIPEX-P) 37.5 MG tablet, , Disp: , Rfl: 0 .  promethazine (PHENERGAN) 25 MG tablet, Take 1 tablet (25 mg total) by mouth every 6 (six) hours as needed for nausea or vomiting. (Patient not taking: Reported on 12/30/2017), Disp: 25 tablet, Rfl: 0 .  propranolol (INDERAL) 40 MG tablet, Take 1 tablet (40 mg total) by mouth daily. (Patient not taking: Reported on 07/04/2017), Disp: 30 tablet, Rfl: 2  Review of Systems  Constitutional: Negative.   Respiratory: Negative for cough  and shortness of breath.   Gastrointestinal: Negative.   Skin: Positive for color change and rash.  Allergic/Immunologic: Negative.   Neurological: Negative for dizziness and headaches.  Psychiatric/Behavioral: Negative.     Social History   Tobacco Use  . Smoking status: Never Smoker  . Smokeless tobacco: Never Used  Substance Use Topics  . Alcohol use: Never    Alcohol/week: 0.0 standard drinks    Frequency: Never    Comment: very rarely      Objective:   BP 104/72 (BP Location: Left Arm, Patient Position: Sitting, Cuff Size: Normal)   Temp 98.2 F (36.8 C)   Resp 16   Ht 5' 5.5" (1.664 m)   Wt 173 lb (78.5 kg)   BMI 28.35 kg/m  Vitals:   02/09/18 1050  BP: 104/72  Resp: 16  Temp: 98.2 F (36.8 C)  Weight: 173 lb (78.5 kg)  Height: 5' 5.5" (1.664 m)     Physical Exam Vitals signs reviewed.  Constitutional:      Appearance: She is well-developed.  HENT:     Head: Normocephalic and atraumatic.     Right Ear: External ear normal.     Left Ear: External ear normal.     Nose: Nose normal.  Eyes:     Conjunctiva/sclera: Conjunctivae normal.  Neck:     Musculoskeletal: Normal range of motion.  Cardiovascular:  Rate and Rhythm: Normal rate and regular rhythm.     Heart sounds: Normal heart sounds.  Pulmonary:     Effort: Pulmonary effort is normal. No respiratory distress.     Breath sounds: Normal breath sounds.  Musculoskeletal: Normal range of motion.  Skin:    General: Skin is warm and dry.     Comments: Mild rash of the face consistent with rosacea.  Possible acne.  Neurological:     General: No focal deficit present.     Mental Status: She is alert and oriented to person, place, and time.  Psychiatric:        Behavior: Behavior normal.        Thought Content: Thought content normal.        Judgment: Judgment normal.         Assessment & Plan    1. Rosacea  - doxycycline (VIBRA-TABS) 100 MG tablet; Take 1 tablet (100 mg total) by mouth  2 (two) times daily.  Dispense: 30 tablet; Refill: 0 - Ambulatory referral to Dermatology  2. Acne, unspecified acne type  - doxycycline (VIBRA-TABS) 100 MG tablet; Take 1 tablet (100 mg total) by mouth 2 (two) times daily.  Dispense: 30 tablet; Refill: 0 - Ambulatory referral to Dermatology    I have done the exam and reviewed the above chart and it is accurate to the best of my knowledge. Dentist has been used in this note in any air is in the dictation or transcription are unintentional.  [name], MD  [facility]

## 2018-02-10 ENCOUNTER — Other Ambulatory Visit: Payer: Self-pay | Admitting: Family Medicine

## 2018-02-10 NOTE — Telephone Encounter (Signed)
Pt needing a refill on phentermine (ADIPEX-P) 37.5 MG tablet   as discussed in visit.  Please fill at:  [facility] - [place], [place] - [address] [phone] (Phone) [phone] (Fax)   Thanks, [facility]

## 2018-02-14 MED ORDER — PHENTERMINE HCL 37.5 MG PO TABS: 37.5000 mg | ORAL_TABLET | Freq: Every day | ORAL | 2 refills | Status: DC

## 2018-02-14 NOTE — Telephone Encounter (Signed)
phentermine (ADIPEX-P) 37.5 MG tablet - pt is calling about the medication. Needing this refilled asap.  Thanks, [employer]

## 2018-02-14 NOTE — Telephone Encounter (Signed)
Please review. Thanks!  

## 2018-04-24 MED FILL — PHENTERMINE 37.5 MG TABLET: 37.5 | 30 days supply | Qty: 30 | Fill #0

## 2018-06-07 MED FILL — DOXYCYCLINE HYC 100 MG CAPS: 100 | 30 days supply | Qty: 30 | Fill #0

## 2018-06-08 ENCOUNTER — Ambulatory Visit (INDEPENDENT_AMBULATORY_CARE_PROVIDER_SITE_OTHER): Payer: No Typology Code available for payment source | Admitting: Family Medicine

## 2018-06-08 ENCOUNTER — Other Ambulatory Visit: Payer: Self-pay

## 2018-06-08 ENCOUNTER — Encounter: Payer: Self-pay | Admitting: Family Medicine

## 2018-06-08 VITALS — BP 122/68 | HR 80 | Temp 98.3°F | Resp 16 | Ht 66.0 in | Wt 169.0 lb

## 2018-06-08 DIAGNOSIS — Z0000 Encounter for general adult medical examination without abnormal findings: Secondary | ICD-10-CM | POA: Diagnosis not present

## 2018-06-08 DIAGNOSIS — K219 Gastro-esophageal reflux disease without esophagitis: Secondary | ICD-10-CM

## 2018-06-08 MED ORDER — PANTOPRAZOLE SODIUM 40 MG PO TBEC: 40.0000 mg | DELAYED_RELEASE_TABLET | Freq: Every day | ORAL | 3 refills | Status: DC

## 2018-06-08 NOTE — Progress Notes (Signed)
Patient: Leah Burnett, Female    DOB: 20-Jul-1970, 48 y.o.   MRN: [id number] Visit Date: 06/08/2018  Today's Provider: [name], MD   Chief Complaint  Patient presents with  . Annual Exam  . Gastroesophageal Reflux   Subjective:     Annual physical exam Leah Burnett is a 48 y.o. female who presents today for health maintenance and complete physical. She feels well. She reports exercising at least 3x weekly. She reports she is sleeping well.  Mammogram- 08/05/2017. Normal. Tdap- Pap- s/p hysterectomy and left ovary removed.       Review of Systems  Constitutional: Negative.   HENT: Negative.   Eyes: Negative.   Respiratory: Negative.   Cardiovascular: Negative.   Gastrointestinal: Negative.        GERD worse recently despite prilosec. Worse since she has been on Doxy fo rosacea.  Endocrine: Negative.   Genitourinary: Negative.   Musculoskeletal: Negative.   Skin: Negative.   Allergic/Immunologic: Positive for environmental allergies.  Neurological: Negative.   Hematological: Negative.   Psychiatric/Behavioral: Negative.     Social History      She  reports that she has never smoked. She has never used smokeless tobacco. She reports that she does not drink alcohol or use drugs.       Social History   Socioeconomic History  . Marital status: Married    Spouse name: Not on file  . Number of children: Not on file  . Years of education: Not on file  . Highest education level: Not on file  Occupational History  . Not on file  Social Needs  . Financial resource strain: Not on file  . Food insecurity:    Worry: Not on file    Inability: Not on file  . Transportation needs:    Medical: Not on file    Non-medical: Not on file  Tobacco Use  . Smoking status: Never Smoker  . Smokeless tobacco: Never Used  Substance and Sexual Activity  . Alcohol use: Never    Alcohol/week: 0.0 standard drinks    Frequency: Never    Comment: very rarely   . Drug use: No  . Sexual activity: Not on file  Lifestyle  . Physical activity:    Days per week: Not on file    Minutes per session: Not on file  . Stress: Not on file  Relationships  . Social connections:    Talks on phone: Not on file    Gets together: Not on file    Attends religious service: Not on file    Active member of club or organization: Not on file    Attends meetings of clubs or organizations: Not on file    Relationship status: Not on file  Other Topics Concern  . Not on file  Social History Narrative  . Not on file    Past Medical History:  Diagnosis Date  . Duodenitis   . Endometriosis 2002  . External thrombosed hemorrhoids 2009  . Hypertension      Patient Active Problem List   Diagnosis Date Noted  . Hypertension 06/02/2016  . Migraine headache without aura 05/12/2016  . Degeneration of intervertebral disc of lumbar region 01/28/2015  . Neuritis or radiculitis due to rupture of lumbar intervertebral disc 01/28/2015  . Lumbar radiculitis 01/28/2015  . DDD (degenerative disc disease), lumbar 01/28/2015  . Adjustment disorder with anxiety 04/30/2014  . Allergic rhinitis 04/30/2014  . Anxiety 04/30/2014  .  Fibrositis 04/30/2014  . Hot flash, menopausal 04/30/2014  . Cannot sleep 04/30/2014  . SVT (supraventricular tachycardia) (HCC) 04/30/2014    Past Surgical History:  Procedure Laterality Date  . ABDOMINAL HYSTERECTOMY    . CHOLECYSTECTOMY  2009  . ovary     right  . OVARY SURGERY  2002    Family History        Family Status  Relation Name Status  . Mother  Alive  . Father  Alive  . Brother  Alive  . Brother  Alive  . PGM  (Not Specified)  . PGF  (Not Specified)  . Neg Hx  (Not Specified)        Her family history includes Anemia in her mother; Cirrhosis in her mother; Heart disease in her father; Hyperlipidemia in her father and mother; Hypertension in her father; Stroke in her father, paternal grandfather, and paternal  grandmother. There is no history of Breast cancer.      No Known Allergies   Current Outpatient Medications:  .  doxycycline (VIBRA-TABS) 100 MG tablet, Take 1 tablet (100 mg total) by mouth 2 (two) times daily., Disp: 30 tablet, Rfl: 0 .  fluticasone (FLONASE) 50 MCG/ACT nasal spray, Place into both nostrils daily., Disp: , Rfl:  .  hydrochlorothiazide (HYDRODIURIL) 12.5 MG tablet, Take 1 tablet (12.5 mg total) by mouth daily., Disp: 30 tablet, Rfl: 12 .  CINNAMON PO, Take by mouth., Disp: , Rfl:  .  doxycycline (VIBRAMYCIN) 100 MG capsule, Take 1 capsule (100 mg total) by mouth 2 (two) times daily. (Patient not taking: Reported on 12/30/2017), Disp: 20 capsule, Rfl: 0 .  HYDROcodone-acetaminophen (NORCO/VICODIN) 5-325 MG tablet, Take 1 tablet by mouth every 4 (four) hours as needed for moderate pain. (Patient not taking: Reported on 12/30/2017), Disp: 25 tablet, Rfl: 0 .  KRILL OIL PO, Take 2 tablets by mouth daily., Disp: , Rfl:  .  phentermine (ADIPEX-P) 37.5 MG tablet, Take 1 tablet (37.5 mg total) by mouth daily before breakfast. (Patient not taking: Reported on 06/08/2018), Disp: 30 tablet, Rfl: 2 .  promethazine (PHENERGAN) 25 MG tablet, Take 1 tablet (25 mg total) by mouth every 6 (six) hours as needed for nausea or vomiting. (Patient not taking: Reported on 12/30/2017), Disp: 25 tablet, Rfl: 0 .  propranolol (INDERAL) 40 MG tablet, Take 1 tablet (40 mg total) by mouth daily. (Patient not taking: Reported on 07/04/2017), Disp: 30 tablet, Rfl: 2 .  Vitamin D, Ergocalciferol, (DRISDOL) 50000 units CAPS capsule, , Disp: , Rfl: 3   Patient Care Team: Maple Hudson., MD as PCP - General (Family Medicine)    Objective:    Vitals: BP 122/68 (BP Location: Left Arm, Patient Position: Sitting, Cuff Size: Normal)   Pulse 80   Temp 98.3 F (36.8 C)   Resp 16   Ht 5\' 6"  (1.676 m)   Wt 169 lb (76.7 kg)   SpO2 99%   BMI 27.28 kg/m    Vitals:   06/08/18 0906  BP: 122/68  Pulse: 80   Resp: 16  Temp: 98.3 F (36.8 C)  SpO2: 99%  Weight: 169 lb (76.7 kg)  Height: 5\' 6"  (1.676 m)     Physical Exam Vitals signs and nursing note reviewed. Exam conducted with a chaperone present.  Constitutional:      Appearance: Normal appearance.  HENT:     Head: Normocephalic.     Right Ear: Tympanic membrane, ear canal and external ear normal.  Left Ear: Tympanic membrane, ear canal and external ear normal.     Nose: Nose normal.     Mouth/Throat:     Mouth: Mucous membranes are moist.     Pharynx: Oropharynx is clear.  Eyes:     Extraocular Movements: Extraocular movements intact.     Conjunctiva/sclera: Conjunctivae normal.     Pupils: Pupils are equal, round, and reactive to light.  Neck:     Musculoskeletal: Normal range of motion and neck supple.  Cardiovascular:     Rate and Rhythm: Normal rate and regular rhythm.     Pulses: Normal pulses.     Heart sounds: Normal heart sounds.  Pulmonary:     Effort: Pulmonary effort is normal.     Breath sounds: Normal breath sounds.  Chest:     Breasts:        Right: Normal. No swelling, bleeding, inverted nipple, mass, nipple discharge or tenderness.        Left: Normal. No swelling, bleeding, inverted nipple, mass, nipple discharge, skin change or tenderness.  Abdominal:     General: Bowel sounds are normal.  Musculoskeletal: Normal range of motion.  Skin:    General: Skin is warm and dry.  Neurological:     Mental Status: She is alert and oriented to person, place, and time. Mental status is at baseline.  Psychiatric:        Mood and Affect: Mood normal.        Behavior: Behavior normal.        Thought Content: Thought content normal.        Judgment: Judgment normal.      Depression Screen PHQ 2/9 Scores 06/08/2018 12/30/2017 06/06/2017 06/02/2016  PHQ - 2 Score 0 0 0 0  PHQ- 9 Score - - - 1       Assessment & Plan:     Routine Health Maintenance and Physical Exam  Exercise Activities and Dietary  recommendations Goals   None     Immunization History  Administered Date(s) Administered  . H1N1 09/30/2009  . Influenza-Unspecified 10/05/2014    Health Maintenance  Topic Date Due  . HIV Screening  07/15/1983  . TETANUS/TDAP  07/15/1987  . INFLUENZA VACCINE  08/05/2018    S/p Hysterectomy. Discussed health benefits of physical activity, and encouraged her to engage in regular exercise appropriate for her age and condition. Colonoscopy 2013. 1. Annual physical exam  - CBC with Differential/Platelet - Comprehensive metabolic panel - Lipid panel - TSH  2. Gastroesophageal reflux disease, esophagitis presence not specified Failed omeprazole--try protonix 40mg . Pt to check with Dermatology on getting  Off of Doxy to try maybe metrogel. - pantoprazole (PROTONIX) 40 MG tablet; Take 1 tablet (40 mg total) by mouth daily.  Dispense: 30 tablet; Refill: 3      Davona Kinoshita Wendelyn BreslowGilbert Jr, MD  [facility]

## 2018-06-09 LAB — CBC WITH DIFFERENTIAL/PLATELET
Basophils Absolute: 0 10*3/uL (ref 0.0–0.2)
Basos: 1 %
EOS (ABSOLUTE): 0.1 10*3/uL (ref 0.0–0.4)
Eos: 2 %
Hematocrit: 39.5 % (ref 34.0–46.6)
Hemoglobin: 13.3 g/dL (ref 11.1–15.9)
Immature Grans (Abs): 0 10*3/uL (ref 0.0–0.1)
Immature Granulocytes: 0 %
Lymphocytes Absolute: 2.6 10*3/uL (ref 0.7–3.1)
Lymphs: 38 %
MCH: 28.4 pg (ref 26.6–33.0)
MCHC: 33.7 g/dL (ref 31.5–35.7)
MCV: 84 fL (ref 79–97)
Monocytes Absolute: 0.6 10*3/uL (ref 0.1–0.9)
Monocytes: 8 %
Neutrophils Absolute: 3.5 10*3/uL (ref 1.4–7.0)
Neutrophils: 51 %
Platelets: 357 10*3/uL (ref 150–450)
RBC: 4.68 x10E6/uL (ref 3.77–5.28)
RDW: 13.3 % (ref 11.7–15.4)
WBC: 6.8 10*3/uL (ref 3.4–10.8)

## 2018-06-09 LAB — COMPREHENSIVE METABOLIC PANEL
ALT: 85 IU/L — ABNORMAL HIGH (ref 0–32)
AST: 38 IU/L (ref 0–40)
Albumin/Globulin Ratio: 2 (ref 1.2–2.2)
Albumin: 4.5 g/dL (ref 3.8–4.8)
Alkaline Phosphatase: 146 IU/L — ABNORMAL HIGH (ref 39–117)
BUN/Creatinine Ratio: 15 (ref 9–23)
BUN: 12 mg/dL (ref 6–24)
Bilirubin Total: 0.4 mg/dL (ref 0.0–1.2)
CO2: 24 mmol/L (ref 20–29)
Calcium: 9.6 mg/dL (ref 8.7–10.2)
Chloride: 102 mmol/L (ref 96–106)
Creatinine, Ser: 0.8 mg/dL (ref 0.57–1.00)
GFR calc Af Amer: 100 mL/min/{1.73_m2} (ref >59)
GFR calc non Af Amer: 87 mL/min/{1.73_m2} (ref >59)
Globulin, Total: 2.3 g/dL (ref 1.5–4.5)
Glucose: 97 mg/dL (ref 65–99)
Potassium: 3.9 mmol/L (ref 3.5–5.2)
Sodium: 142 mmol/L (ref 134–144)
Total Protein: 6.8 g/dL (ref 6.0–8.5)

## 2018-06-09 LAB — LIPID PANEL
Chol/HDL Ratio: 4.5 ratio — ABNORMAL HIGH (ref 0.0–4.4)
Cholesterol, Total: 185 mg/dL (ref 100–199)
HDL: 41 mg/dL (ref >39)
LDL Calculated: 99 mg/dL (ref 0–99)
Triglycerides: 224 mg/dL — ABNORMAL HIGH (ref 0–149)
VLDL Cholesterol Cal: 45 mg/dL — ABNORMAL HIGH (ref 5–40)

## 2018-06-09 LAB — TSH: TSH: 0.992 u[IU]/mL (ref 0.450–4.500)

## 2018-06-12 ENCOUNTER — Telehealth: Payer: Self-pay

## 2018-06-12 NOTE — Telephone Encounter (Signed)
[id number] 

## 2018-06-12 NOTE — Telephone Encounter (Signed)
-----   Message from Jerrol Banana., MD sent at 06/09/2018 11:48 AM EDT ----- Labs ok with minimal elevation of liver function tests.Limit alcohol and tylenol--plan to repeat on next visit.

## 2018-06-14 NOTE — Telephone Encounter (Signed)
Patient advised as below.  

## 2018-06-19 MED FILL — PANTOPRAZOLE SOD DR 40 MG T: 40 | 30 days supply | Qty: 30 | Fill #0

## 2018-06-19 MED FILL — FINACEA 15 % FOAM: 15 | 30 days supply | Qty: 50 | Fill #0

## 2018-08-09 ENCOUNTER — Ambulatory Visit (INDEPENDENT_AMBULATORY_CARE_PROVIDER_SITE_OTHER): Payer: No Typology Code available for payment source | Admitting: Family Medicine

## 2018-08-09 ENCOUNTER — Encounter: Payer: Self-pay | Admitting: Family Medicine

## 2018-08-09 ENCOUNTER — Other Ambulatory Visit: Payer: Self-pay

## 2018-08-09 VITALS — BP 118/70 | HR 68 | Temp 98.9°F | Resp 16 | Ht 66.0 in | Wt 176.0 lb

## 2018-08-09 DIAGNOSIS — E785 Hyperlipidemia, unspecified: Secondary | ICD-10-CM | POA: Diagnosis not present

## 2018-08-09 DIAGNOSIS — E669 Obesity, unspecified: Secondary | ICD-10-CM | POA: Diagnosis not present

## 2018-08-09 DIAGNOSIS — R7989 Other specified abnormal findings of blood chemistry: Secondary | ICD-10-CM

## 2018-08-09 DIAGNOSIS — R945 Abnormal results of liver function studies: Secondary | ICD-10-CM | POA: Diagnosis not present

## 2018-08-09 DIAGNOSIS — K219 Gastro-esophageal reflux disease without esophagitis: Secondary | ICD-10-CM | POA: Diagnosis not present

## 2018-08-09 MED ORDER — PHENTERMINE HCL 30 MG PO CAPS: 30.0000 mg | ORAL_CAPSULE | ORAL | 0 refills | Status: DC

## 2018-08-09 NOTE — Progress Notes (Signed)
Patient: Leah Burnett Female    DOB: August 24, 1970   48 y.o.   MRN: [id number] Visit Date: 08/09/2018  Today's Provider: Wilhemena Durie, MD   Chief Complaint  Patient presents with  . Follow-up  . Gastroesophageal Reflux  . abnormal LFTs   Subjective:    HPI  Patient comes in today for a follow up. She was last seen in the office 2 months ago. She was c/o worsening GERD symptoms. She was advised to discontinue omeprazole and try Protonix 40mg  daily.   She is also due for a repeat metabolic panel as her liver function test was abnormal.  She would like a diet pill to help her lose weight during pndemic when she cannot go to the gym. BP Readings from Last 3 Encounters:  08/09/18 118/70  06/08/18 122/68  02/09/18 104/72   She is also wanting to start back on phentermine to help with weight loss. She reports that she is having difficulty loosing weight.   Wt Readings from Last 3 Encounters:  08/09/18 174 lb (78.9 kg)  06/08/18 169 lb (76.7 kg)  02/09/18 173 lb (78.5 kg)    No Known Allergies   Current Outpatient Medications:  .  CINNAMON PO, Take by mouth., Disp: , Rfl:  .  doxycycline (VIBRA-TABS) 100 MG tablet, Take 1 tablet (100 mg total) by mouth 2 (two) times daily., Disp: 30 tablet, Rfl: 0 .  doxycycline (VIBRAMYCIN) 100 MG capsule, Take 1 capsule (100 mg total) by mouth 2 (two) times daily. (Patient not taking: Reported on 12/30/2017), Disp: 20 capsule, Rfl: 0 .  fluticasone (FLONASE) 50 MCG/ACT nasal spray, Place into both nostrils daily., Disp: , Rfl:  .  hydrochlorothiazide (HYDRODIURIL) 12.5 MG tablet, Take 1 tablet (12.5 mg total) by mouth daily., Disp: 30 tablet, Rfl: 12 .  HYDROcodone-acetaminophen (NORCO/VICODIN) 5-325 MG tablet, Take 1 tablet by mouth every 4 (four) hours as needed for moderate pain. (Patient not taking: Reported on 12/30/2017), Disp: 25 tablet, Rfl: 0 .  KRILL OIL PO, Take 2 tablets by mouth daily., Disp: , Rfl:  .  pantoprazole  (PROTONIX) 40 MG tablet, Take 1 tablet (40 mg total) by mouth daily., Disp: 30 tablet, Rfl: 3 .  phentermine (ADIPEX-P) 37.5 MG tablet, Take 1 tablet (37.5 mg total) by mouth daily before breakfast. (Patient not taking: Reported on 06/08/2018), Disp: 30 tablet, Rfl: 2 .  promethazine (PHENERGAN) 25 MG tablet, Take 1 tablet (25 mg total) by mouth every 6 (six) hours as needed for nausea or vomiting. (Patient not taking: Reported on 12/30/2017), Disp: 25 tablet, Rfl: 0 .  propranolol (INDERAL) 40 MG tablet, Take 1 tablet (40 mg total) by mouth daily. (Patient not taking: Reported on 07/04/2017), Disp: 30 tablet, Rfl: 2 .  Vitamin D, Ergocalciferol, (DRISDOL) 50000 units CAPS capsule, , Disp: , Rfl: 3  Review of Systems  Constitutional: Positive for activity change and appetite change. Negative for fatigue.  Respiratory: Negative for cough and shortness of breath.   Cardiovascular: Negative for chest pain, palpitations and leg swelling.  Gastrointestinal: Negative for abdominal pain, constipation, diarrhea, nausea and vomiting.  Endocrine: Negative for cold intolerance, heat intolerance, polydipsia and polyphagia.  Musculoskeletal: Negative for arthralgias.  Neurological: Negative for dizziness and headaches.  Psychiatric/Behavioral: Negative for agitation, self-injury, sleep disturbance and suicidal ideas. The patient is not nervous/anxious.     Social History   Tobacco Use  . Smoking status: Never Smoker  . Smokeless tobacco: Never Used  Substance Use Topics  . Alcohol use: Never    Alcohol/week: 0.0 standard drinks    Frequency: Never    Comment: very rarely      Objective:   BP 118/70   Pulse 68   Temp 98.9 F (37.2 C)   Resp 16   Wt 174 lb (78.9 kg)   SpO2 98%   BMI 28.08 kg/m  Vitals:   08/09/18 0856  BP: 118/70  Pulse: 68  Resp: 16  Temp: 98.9 F (37.2 C)  SpO2: 98%  Weight: 174 lb (78.9 kg)     Physical Exam Vitals signs reviewed.  Constitutional:       Appearance: She is well-developed.  HENT:     Head: Normocephalic and atraumatic.     Right Ear: External ear normal.     Left Ear: External ear normal.     Nose: Nose normal.  Eyes:     General: No scleral icterus.    Conjunctiva/sclera: Conjunctivae normal.     Pupils: Pupils are equal, round, and reactive to light.  Neck:     Thyroid: Thyromegaly present.  Cardiovascular:     Rate and Rhythm: Normal rate and regular rhythm.     Heart sounds: Normal heart sounds.  Pulmonary:     Effort: Pulmonary effort is normal.     Breath sounds: Normal breath sounds.  Abdominal:     Palpations: Abdomen is soft.  Lymphadenopathy:     Cervical: No cervical adenopathy.  Skin:    General: Skin is warm and dry.  Neurological:     Mental Status: She is alert and oriented to person, place, and time.  Psychiatric:        Behavior: Behavior normal.        Thought Content: Thought content normal.        Judgment: Judgment normal.      No results found for any visits on 08/09/18.     Assessment & Plan    1. Hyperlipidemia, unspecified hyperlipidemia type  - Lipid panel - Comprehensive metabolic panel  2. Obesity without serious comorbidity, unspecified classification, unspecified obesity type For 3 months only for now - phentermine 30 MG capsule; Take 1 capsule (30 mg total) by mouth every morning.  Dispense: 90 capsule; Refill: 0  3. Gastroesophageal reflux disease, esophagitis presence not specified   4. Abnormal LFTs Probable fatty liver.RTC 3-4 months. - Comprehensive metabolic panel    I, Rachelle L. Presley, CMA, am acting as a Neurosurgeonscribe for Genuine Partsichard L. Sullivan LoneGilbert, MD.   Megan Mansichard Xavier Fournier Jr, MD  [facility]

## 2018-08-10 LAB — LIPID PANEL
Chol/HDL Ratio: 5.1 ratio — ABNORMAL HIGH (ref 0.0–4.4)
Cholesterol, Total: 228 mg/dL — ABNORMAL HIGH (ref 100–199)
HDL: 45 mg/dL (ref >39)
LDL Calculated: 163 mg/dL — ABNORMAL HIGH (ref 0–99)
Triglycerides: 100 mg/dL (ref 0–149)
VLDL Cholesterol Cal: 20 mg/dL (ref 5–40)

## 2018-08-10 LAB — COMPREHENSIVE METABOLIC PANEL
ALT: 36 IU/L — ABNORMAL HIGH (ref 0–32)
AST: 26 IU/L (ref 0–40)
Albumin/Globulin Ratio: 2.1 (ref 1.2–2.2)
Albumin: 4.6 g/dL (ref 3.8–4.8)
Alkaline Phosphatase: 141 IU/L — ABNORMAL HIGH (ref 39–117)
BUN/Creatinine Ratio: 22 (ref 9–23)
BUN: 21 mg/dL (ref 6–24)
Bilirubin Total: 0.2 mg/dL (ref 0.0–1.2)
CO2: 26 mmol/L (ref 20–29)
Calcium: 9.8 mg/dL (ref 8.7–10.2)
Chloride: 99 mmol/L (ref 96–106)
Creatinine, Ser: 0.94 mg/dL (ref 0.57–1.00)
GFR calc Af Amer: 82 mL/min/{1.73_m2} (ref >59)
GFR calc non Af Amer: 71 mL/min/{1.73_m2} (ref >59)
Globulin, Total: 2.2 g/dL (ref 1.5–4.5)
Glucose: 92 mg/dL (ref 65–99)
Potassium: 4 mmol/L (ref 3.5–5.2)
Sodium: 140 mmol/L (ref 134–144)
Total Protein: 6.8 g/dL (ref 6.0–8.5)

## 2018-08-16 ENCOUNTER — Telehealth: Payer: Self-pay

## 2018-08-16 NOTE — Telephone Encounter (Signed)
-----   Message from Jerrol Banana., MD sent at 08/16/2018  8:02 AM EDT ----- Lipids higher--Probable mild fatty liver. Would f/u 2-4 months.

## 2018-08-16 NOTE — Telephone Encounter (Signed)
Patient advised as below.  

## 2018-09-18 MED FILL — PANTOPRAZOLE SOD DR 40 MG T: 40 | 30 days supply | Qty: 30 | Fill #1

## 2018-10-31 ENCOUNTER — Other Ambulatory Visit: Payer: Self-pay | Admitting: Family Medicine

## 2018-10-31 DIAGNOSIS — Z1231 Encounter for screening mammogram for malignant neoplasm of breast: Secondary | ICD-10-CM

## 2018-11-08 NOTE — Progress Notes (Signed)
Patient: Leah Burnett Female    DOB: 01/29/1970   48 y.o.   MRN: [id number] Visit Date: 11/09/2018  Today's Provider: Wilhemena Durie, MD   Chief Complaint  Patient presents with  . Follow-up  . Hyperlipidemia   Subjective:     HPI   Hyperlipidemia, unspecified hyperlipidemia type From 08/09/2018-labs checked. Lipids higher--Probable mild fatty liver. Would f/u 2-4 months.  Obesity without serious comorbidity, unspecified classification, unspecified obesity type From 08/09/2018-For 3 months only for now, phentermine 30 MG capsule; Take 1 capsule (30 mg total) by mouth every morning.     No Known Allergies   Current Outpatient Medications:  .  CINNAMON PO, Take by mouth., Disp: , Rfl:  .  doxycycline (VIBRA-TABS) 100 MG tablet, Take 1 tablet (100 mg total) by mouth 2 (two) times daily., Disp: 30 tablet, Rfl: 0 .  fluticasone (FLONASE) 50 MCG/ACT nasal spray, Place into both nostrils daily., Disp: , Rfl:  .  hydrochlorothiazide (HYDRODIURIL) 12.5 MG tablet, Take 1 tablet (12.5 mg total) by mouth daily., Disp: 30 tablet, Rfl: 12 .  pantoprazole (PROTONIX) 40 MG tablet, Take 1 tablet (40 mg total) by mouth daily., Disp: 30 tablet, Rfl: 3 .  phentermine 30 MG capsule, Take 1 capsule (30 mg total) by mouth every morning., Disp: 90 capsule, Rfl: 0 .  doxycycline (VIBRAMYCIN) 100 MG capsule, Take 1 capsule (100 mg total) by mouth 2 (two) times daily. (Patient not taking: Reported on 12/30/2017), Disp: 20 capsule, Rfl: 0 .  HYDROcodone-acetaminophen (NORCO/VICODIN) 5-325 MG tablet, Take 1 tablet by mouth every 4 (four) hours as needed for moderate pain. (Patient not taking: Reported on 12/30/2017), Disp: 25 tablet, Rfl: 0 .  KRILL OIL PO, Take 2 tablets by mouth daily., Disp: , Rfl:  .  promethazine (PHENERGAN) 25 MG tablet, Take 1 tablet (25 mg total) by mouth every 6 (six) hours as needed for nausea or vomiting. (Patient not taking: Reported on 12/30/2017), Disp: 25  tablet, Rfl: 0 .  propranolol (INDERAL) 40 MG tablet, Take 1 tablet (40 mg total) by mouth daily. (Patient not taking: Reported on 07/04/2017), Disp: 30 tablet, Rfl: 2 .  Vitamin D, Ergocalciferol, (DRISDOL) 50000 units CAPS capsule, , Disp: , Rfl: 3  Review of Systems  Constitutional: Negative for appetite change, chills, fatigue and fever.  Eyes: Negative.   Respiratory: Negative for chest tightness and shortness of breath.   Cardiovascular: Negative for chest pain and palpitations.  Gastrointestinal: Negative for abdominal pain, nausea and vomiting.  Endocrine: Negative.   Genitourinary: Negative.   Allergic/Immunologic: Negative.   Neurological: Negative for dizziness and weakness.  Psychiatric/Behavioral: Negative.     Social History   Tobacco Use  . Smoking status: Never Smoker  . Smokeless tobacco: Never Used  Substance Use Topics  . Alcohol use: Never    Alcohol/week: 0.0 standard drinks    Frequency: Never    Comment: very rarely      Objective:   BP 116/79 (BP Location: Left Arm, Patient Position: Sitting, Cuff Size: Large)   Pulse 98   Temp 97.6 F (36.4 C) (Other (Comment))   Resp 16   Ht 5\' 6"  (1.676 m)   Wt 170 lb (77.1 kg)   SpO2 97%   BMI 27.44 kg/m  Vitals:   11/09/18 1128  BP: 116/79  Pulse: 98  Resp: 16  Temp: 97.6 F (36.4 C)  TempSrc: Other (Comment)  SpO2: 97%  Weight: 170 lb (77.1 kg)  Height: 5\' 6"  (1.676 m)  Body mass index is 27.44 kg/m.   Physical Exam Vitals signs reviewed.  Constitutional:      Appearance: She is well-developed.  HENT:     Head: Normocephalic and atraumatic.     Right Ear: External ear normal.     Left Ear: External ear normal.     Nose: Nose normal.  Eyes:     General: No scleral icterus.    Conjunctiva/sclera: Conjunctivae normal.     Pupils: Pupils are equal, round, and reactive to light.  Neck:     Thyroid: Thyromegaly present.  Cardiovascular:     Rate and Rhythm: Normal rate and regular rhythm.      Heart sounds: Normal heart sounds.  Pulmonary:     Effort: Pulmonary effort is normal.     Breath sounds: Normal breath sounds.  Abdominal:     Palpations: Abdomen is soft.  Lymphadenopathy:     Cervical: No cervical adenopathy.  Skin:    General: Skin is warm and dry.  Neurological:     Mental Status: She is alert and oriented to person, place, and time.  Psychiatric:        Behavior: Behavior normal.        Thought Content: Thought content normal.        Judgment: Judgment normal.      No results found for any visits on 11/09/18.     Assessment & Plan    1. Hyperlipidemia, unspecified hyperlipidemia type Labs on next visit.  2. Obesity without serious comorbidity, unspecified classification, unspecified obesity type Refill for 3 months.She has lost 6 lbs. - phentermine 30 MG capsule; Take 1 capsule (30 mg total) by mouth every morning.  Dispense: 90 capsule; Refill: 0     Richard 13/05/20, MD  [facility]

## 2018-11-09 ENCOUNTER — Other Ambulatory Visit: Payer: Self-pay

## 2018-11-09 ENCOUNTER — Ambulatory Visit (INDEPENDENT_AMBULATORY_CARE_PROVIDER_SITE_OTHER): Payer: No Typology Code available for payment source | Admitting: Family Medicine

## 2018-11-09 ENCOUNTER — Encounter: Payer: Self-pay | Admitting: Family Medicine

## 2018-11-09 VITALS — BP 116/79 | HR 98 | Temp 97.6°F | Resp 16 | Ht 66.0 in | Wt 170.0 lb

## 2018-11-09 DIAGNOSIS — E669 Obesity, unspecified: Secondary | ICD-10-CM | POA: Diagnosis not present

## 2018-11-09 DIAGNOSIS — E785 Hyperlipidemia, unspecified: Secondary | ICD-10-CM | POA: Diagnosis not present

## 2018-11-15 MED ORDER — PHENTERMINE HCL 30 MG PO CAPS: 30.0000 mg | ORAL_CAPSULE | ORAL | 0 refills | Status: DC

## 2018-12-04 MED FILL — DOXYCYCLINE HYC 100 MG CAPS: 100 | 30 days supply | Qty: 30 | Fill #1

## 2019-04-23 ENCOUNTER — Telehealth: Payer: Self-pay

## 2019-04-23 NOTE — Telephone Encounter (Signed)
Leah Burnett Please advise...   Copied from CRM [id number]. Topic: Appointment Scheduling - Scheduling Inquiry for Clinic >> Apr 23, 2019  8:43 AM Leah Burnett wrote: [name] Patient has tickle in throat, congestion, stuffy ears. 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

## 2019-04-23 NOTE — Telephone Encounter (Signed)
Copied from CRM [id number]. Topic: Appointment Scheduling - Scheduling Inquiry for Clinic >> Apr 23, 2019  8:43 AM Angela Nevin wrote: [name] Patient has tickle in throat, congestion, stuffy ears. 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

## 2019-04-23 NOTE — Telephone Encounter (Signed)
Virtual OV thanks, I don't have appointments after 4:00 PM. She can have the 4 if it's open.

## 2019-04-23 NOTE — Telephone Encounter (Signed)
Called and spoke with patient. She was advised that after speaking with the provider, she needed to still be a virtual visit. The patient agreed to have a virtual visit due to her symptoms

## 2019-04-23 NOTE — Progress Notes (Signed)
Virtual telephone visit    Virtual Visit via Telephone Note   This visit type was conducted due to national recommendations for restrictions regarding the COVID-19 Pandemic (e.g. social distancing) in an effort to limit this patient's exposure and mitigate transmission in our community. Due to her co-morbid illnesses, this patient is at least at moderate risk for complications without adequate follow up. This format is felt to be most appropriate for this patient at this time. The patient did not have access to video technology or had technical difficulties with video requiring transitioning to audio format only (telephone). Physical exam was limited to content and character of the telephone converstion.    Patient location: Home Provider location: Office   Patient: Leah Burnett   DOB: 02/10/1970   49 y.o. Female  MRN: [id number] Visit Date: 04/24/2019  Today's Provider: Trinna Post, PA-C  Subjective:    Nikki Dom Landry Lookingbill,acting as a scribe for Trinna Post, PA-C.,have documented all relevant documentation on the behalf of Trinna Post, PA-C,as directed by  Trinna Post, PA-C while in the presence of Trinna Post, PA-C.  Chief Complaint  Patient presents with  . URI   URI  This is a recurrent problem. The current episode started more than 1 month ago. The problem has been unchanged. There has been no fever. The fever has been present for less than 1 day. Associated symptoms include congestion, coughing, ear pain, rhinorrhea, sinus pain, a sore throat and wheezing. Pertinent negatives include no headaches, nausea or sneezing. She has tried antihistamine and increased fluids for the symptoms. The treatment provided moderate relief.   Patient reports she has itchy eyes, throat, cough when she lies down, and a tickle in there throat. She is currently taking flonase and xyzal. She took a singulair last night. She has been using flonase for years. She took xyzal on  and off for a month. She takes xyzal for about a week at a time, sometimes 3-4 days at a time. Two out of three of her children take allergy shots. She is interested in seeing an allergist.       Medications: Outpatient Medications Prior to Visit  Medication Sig  . CINNAMON PO Take by mouth.  . clindamycin (CLEOCIN T) 1 % external solution   . doxycycline (VIBRA-TABS) 100 MG tablet Take 1 tablet (100 mg total) by mouth 2 (two) times daily.  Marland Kitchen FINACEA 15 % FOAM   . fluticasone (FLONASE) 50 MCG/ACT nasal spray Place into both nostrils daily.  . hydrochlorothiazide (HYDRODIURIL) 12.5 MG tablet Take 1 tablet (12.5 mg total) by mouth daily.  Marland Kitchen KRILL OIL PO Take 2 tablets by mouth daily.  . minocycline (MINOCIN) 100 MG capsule Take by mouth daily.  . pantoprazole (PROTONIX) 40 MG tablet Take 1 tablet (40 mg total) by mouth daily.  . phentermine 30 MG capsule Take 1 capsule (30 mg total) by mouth every morning.  . Vitamin D, Ergocalciferol, (DRISDOL) 50000 units CAPS capsule   . doxycycline (VIBRAMYCIN) 100 MG capsule Take 1 capsule (100 mg total) by mouth 2 (two) times daily. (Patient not taking: Reported on 12/30/2017)  . HYDROcodone-acetaminophen (NORCO/VICODIN) 5-325 MG tablet Take 1 tablet by mouth every 4 (four) hours as needed for moderate pain. (Patient not taking: Reported on 12/30/2017)  . promethazine (PHENERGAN) 25 MG tablet Take 1 tablet (25 mg total) by mouth every 6 (six) hours as needed for nausea or vomiting. (Patient not taking: Reported on 12/30/2017)  .  propranolol (INDERAL) 40 MG tablet Take 1 tablet (40 mg total) by mouth daily. (Patient not taking: Reported on 07/04/2017)   No facility-administered medications prior to visit.    Review of Systems  Constitutional: Negative.   HENT: Positive for congestion, ear pain, postnasal drip, rhinorrhea, sinus pressure, sinus pain and sore throat. Negative for sneezing.   Respiratory: Positive for cough and wheezing. Negative for  shortness of breath.   Cardiovascular: Negative.   Gastrointestinal: Negative for nausea.  Neurological: Negative for headaches.  Hematological: Negative.         Objective:    There were no vitals taken for this visit.       Assessment & Plan:    1. Allergic rhinitis due to pollen, unspecified seasonality  Add singulair to regimen of xyzal of flonase. Counseled on taking xyzal consistently. Will refer to allergist.   - montelukast (SINGULAIR) 10 MG tablet; Take 1 tablet (10 mg total) by mouth at bedtime.  Dispense: 90 tablet; Refill: 1    Return if symptoms worsen or fail to improve.    I discussed the assessment and treatment plan with the patient. The patient was provided an opportunity to ask questions and all were answered. The patient agreed with the plan and demonstrated an understanding of the instructions.   The patient was advised to call back or seek an in-person evaluation if the symptoms worsen or if the condition fails to improve as anticipated.  I provided 15 minutes of non-face-to-face time during this encounter.  ITrey Sailors, PA-C, have reviewed all documentation for this visit. The documentation on 04/24/19 for the exam, diagnosis, procedures, and orders are all accurate and complete.   Maryella Shivers [facility] [phone] (phone) [phone] (fax)  [facility]

## 2019-04-23 NOTE — Telephone Encounter (Signed)
Patient schedule a telephone visit for tomorrow,04/24/2019.

## 2019-04-24 ENCOUNTER — Ambulatory Visit (INDEPENDENT_AMBULATORY_CARE_PROVIDER_SITE_OTHER): Payer: No Typology Code available for payment source | Admitting: Physician Assistant

## 2019-04-24 DIAGNOSIS — J301 Allergic rhinitis due to pollen: Secondary | ICD-10-CM | POA: Diagnosis not present

## 2019-04-24 DIAGNOSIS — T7840XA Allergy, unspecified, initial encounter: Secondary | ICD-10-CM

## 2019-04-24 MED ORDER — MONTELUKAST SODIUM 10 MG PO TABS: 10.0000 mg | ORAL_TABLET | Freq: Every day | ORAL | 1 refills | Status: DC

## 2019-04-24 NOTE — Patient Instructions (Signed)
Allergic Rhinitis, Adult Allergic rhinitis is a reaction to allergens in the air. Allergens are tiny specks (particles) in the air that cause your body to have an allergic reaction. This condition cannot be passed from person to person (is not contagious). Allergic rhinitis cannot be cured, but it can be controlled. There are two types of allergic rhinitis:  Seasonal. This type is also called hay fever. It happens only during certain times of the year.  Perennial. This type can happen at any time of the year. What are the causes? This condition may be caused by:  Pollen from grasses, trees, and weeds.  House dust mites.  Pet dander.  Mold. What are the signs or symptoms? Symptoms of this condition include:  Sneezing.  Runny or stuffy nose (nasal congestion).  A lot of mucus in the back of the throat (postnasal drip).  Itchy nose.  Tearing of the eyes.  Trouble sleeping.  Being sleepy during day. How is this treated? There is no cure for this condition. You should avoid things that trigger your symptoms (allergens). Treatment can help to relieve symptoms. This may include:  Medicines that block allergy symptoms, such as antihistamines. These may be given as a shot, nasal spray, or pill.  Shots that are given until your body becomes less sensitive to the allergen (desensitization).  Stronger medicines, if all other treatments have not worked. Follow these instructions at home: Avoiding allergens   Find out what you are allergic to. Common allergens include smoke, dust, and pollen.  Avoid them if you can. These are some of the things that you can do to avoid allergens: ? Replace carpet with wood, tile, or vinyl flooring. Carpet can trap dander and dust. ? Clean any mold found in the home. ? Do not smoke. Do not allow smoking in your home. ? Change your heating and air conditioning filter at least once a month. ? During allergy season:  Keep windows closed as much as  you can. If possible, use air conditioning when there is a lot of pollen in the air.  Use a special filter for allergies with your furnace and air conditioner.  Plan outdoor activities when pollen counts are lowest. This is usually during the early morning or evening hours.  If you do go outdoors when pollen count is high, wear a special mask for people with allergies.  When you come indoors, take a shower and change your clothes before sitting on furniture or bedding. General instructions  Do not use fans in your home.  Do not hang clothes outside to dry.  Wear sunglasses to keep pollen out of your eyes.  Wash your hands right away after you touch household pets.  Take over-the-counter and prescription medicines only as told by your doctor.  Keep all follow-up visits as told by your doctor. This is important. Contact a doctor if:  You have a fever.  You have a cough that does not go away (is persistent).  You start to make whistling sounds when you breathe (wheeze).  Your symptoms do not get better with treatment.  You have thick fluid coming from your nose.  You start to have nosebleeds. Get help right away if:  Your tongue or your lips are swollen.  You have trouble breathing.  You feel dizzy or you feel like you are going to pass out (faint).  You have cold sweats. Summary  Allergic rhinitis is a reaction to allergens in the air.  This condition may be   caused by allergens. These include pollen, dust mites, pet dander, and mold.  Symptoms include a runny, itchy nose, sneezing, or tearing eyes. You may also have trouble sleeping or feel sleepy during the day.  Treatment includes taking medicines and avoiding allergens. You may also get shots or take stronger medicines.  Get help if you have a fever or a cough that does not stop. Get help right away if you are short of breath. This information is not intended to replace advice given to you by your health care  provider. Make sure you discuss any questions you have with your health care provider. Document Revised: 04/11/2018 Document Reviewed: 07/12/2017 Elsevier Patient Education  2020 [employer]  

## 2019-05-08 NOTE — Progress Notes (Signed)
Established patient visit   Patient: Leah Burnett   DOB: 14-Apr-1970   49 y.o. Female  MRN: [id number] Visit Date: 05/09/2019  Today's healthcare provider: Wilhemena Durie, MD   Chief Complaint  Patient presents with  . Hyperlipidemia   Subjective    HPI  Patient overall feels well.  She has had both Covid vaccines.  She would like refill on diet pills to try to lose some more weight. Family history of father with CABG at age 45 and mother with increased lipids and primary biliary cirrhosis. Lipid/Cholesterol, follow-up  Last Lipid Panel: Lab Results  Component Value Date   CHOL 228 (H) 08/09/2018   LDLCALC 163 (H) 08/09/2018   HDL 45 08/09/2018   TRIG 100 08/09/2018   ALT 36 (H) 08/09/2018   AST 26 08/09/2018   PLT 357 06/08/2018    She was last seen for this 9 months ago.  Management since that visit includes; labs checked showing-Lipids higher--Probable mild fatty liver. Would f/u 2-4 months. She reports excellent compliance with treatment. She is not having side effects.  She is following a Regular diet. Current exercise: walking  Wt Readings from Last 3 Encounters:  05/09/19 173 lb 9.6 oz (78.7 kg)  11/09/18 170 lb (77.1 kg)  08/09/18 176 lb (79.8 kg)   Last metabolic panel Lab Results  Component Value Date   GLUCOSE 92 08/09/2018   NA 140 08/09/2018   K 4.0 08/09/2018   BUN 21 08/09/2018   CREATININE 0.94 08/09/2018   GFRNONAA 71 08/09/2018   GFRAA 82 08/09/2018   CALCIUM 9.8 08/09/2018   AST 26 08/09/2018   ALT 36 (H) 08/09/2018   The 10-year ASCVD risk score Mikey Bussing DC Jr., et al., 2013) is: 1.9%  -----------------------------------------------------------------------------------------  Obesity without serious comorbidity, unspecified classification, unspecified obesity type From 11/09/2018-Refilled phentermine 30 mg for 3 months. She has lost 6 lbs.  Past Medical History:  Diagnosis Date  . Duodenitis   . Endometriosis 2002  .  External thrombosed hemorrhoids 2009  . Hypertension        Medications: Outpatient Medications Prior to Visit  Medication Sig  . clindamycin (CLEOCIN T) 1 % external solution   . doxycycline (VIBRA-TABS) 100 MG tablet Take 1 tablet (100 mg total) by mouth 2 (two) times daily.  . fluticasone (FLONASE) 50 MCG/ACT nasal spray Place into both nostrils daily.  . hydrochlorothiazide (HYDRODIURIL) 12.5 MG tablet Take 1 tablet (12.5 mg total) by mouth daily.  Marland Kitchen KRILL OIL PO Take 2 tablets by mouth daily.  . minocycline (MINOCIN) 100 MG capsule Take by mouth daily.  . montelukast (SINGULAIR) 10 MG tablet Take 1 tablet (10 mg total) by mouth at bedtime.  . pantoprazole (PROTONIX) 40 MG tablet Take 1 tablet (40 mg total) by mouth daily.  . phentermine 30 MG capsule Take 1 capsule (30 mg total) by mouth every morning.  . Vitamin D, Ergocalciferol, (DRISDOL) 50000 units CAPS capsule   . CINNAMON PO Take by mouth.  . doxycycline (VIBRAMYCIN) 100 MG capsule Take 1 capsule (100 mg total) by mouth 2 (two) times daily. (Patient not taking: Reported on 12/30/2017)  . FINACEA 15 % FOAM   . HYDROcodone-acetaminophen (NORCO/VICODIN) 5-325 MG tablet Take 1 tablet by mouth every 4 (four) hours as needed for moderate pain. (Patient not taking: Reported on 12/30/2017)  . promethazine (PHENERGAN) 25 MG tablet Take 1 tablet (25 mg total) by mouth every 6 (six) hours as needed for nausea or vomiting. (  Patient not taking: Reported on 12/30/2017)  . propranolol (INDERAL) 40 MG tablet Take 1 tablet (40 mg total) by mouth daily. (Patient not taking: Reported on 07/04/2017)   No facility-administered medications prior to visit.    Review of Systems  Constitutional: Negative for appetite change, chills, fatigue and fever.  HENT: Negative.   Eyes: Negative.   Respiratory: Negative for chest tightness and shortness of breath.   Cardiovascular: Negative for chest pain and palpitations.  Gastrointestinal: Negative for  abdominal pain, nausea and vomiting.  Endocrine: Negative.   Allergic/Immunologic: Negative.   Neurological: Negative for dizziness and weakness.  Psychiatric/Behavioral: Negative.        Objective    BP 109/77 (BP Location: Right Arm, Patient Position: Sitting, Cuff Size: Large)   Pulse 84   Temp (!) 97.3 F (36.3 C) (Temporal)   Ht 5\' 6"  (1.676 m)   Wt 173 lb 9.6 oz (78.7 kg)   BMI 28.02 kg/m  Wt Readings from Last 3 Encounters:  05/09/19 173 lb 9.6 oz (78.7 kg)  11/09/18 170 lb (77.1 kg)  08/09/18 176 lb (79.8 kg)      Physical Exam Vitals reviewed.  Constitutional:      Appearance: She is well-developed.  HENT:     Head: Normocephalic and atraumatic.     Right Ear: External ear normal.     Left Ear: External ear normal.     Nose: Nose normal.  Eyes:     General: No scleral icterus.    Conjunctiva/sclera: Conjunctivae normal.     Pupils: Pupils are equal, round, and reactive to light.  Neck:     Thyroid: Thyromegaly present.  Cardiovascular:     Rate and Rhythm: Normal rate and regular rhythm.     Heart sounds: Normal heart sounds.  Pulmonary:     Effort: Pulmonary effort is normal.     Breath sounds: Normal breath sounds.  Abdominal:     Palpations: Abdomen is soft.  Lymphadenopathy:     Cervical: No cervical adenopathy.  Skin:    General: Skin is warm and dry.  Neurological:     Mental Status: She is alert and oriented to person, place, and time.  Psychiatric:        Behavior: Behavior normal.        Thought Content: Thought content normal.        Judgment: Judgment normal.       No results found for any visits on 05/09/19.  Assessment & Plan     1. Hyperlipidemia, unspecified hyperlipidemia type Family history of early CAD in her father - Lipid panel  2. Gastroesophageal reflux disease Refill pantoprazole - pantoprazole (PROTONIX) 40 MG tablet; Take 1 tablet (40 mg total) by mouth daily.  Dispense: 30 tablet; Refill: 3  3. Obesity  without serious comorbidity, unspecified classification, unspecified obesity type Phentermine for 3 more months.  Reassess then. - phentermine 30 MG capsule; Take 1 capsule (30 mg total) by mouth every morning.  Dispense: 90 capsule; Refill: 0  4. Allergic rhinitis due to pollen, unspecified seasonality    No follow-ups on file.         Richard 07/09/19, MD  [facility] [phone] (phone) [phone] (fax)  [facility]

## 2019-05-09 ENCOUNTER — Other Ambulatory Visit (HOSPITAL_COMMUNITY): Payer: Self-pay | Admitting: Family Medicine

## 2019-05-09 ENCOUNTER — Ambulatory Visit (INDEPENDENT_AMBULATORY_CARE_PROVIDER_SITE_OTHER): Payer: No Typology Code available for payment source | Admitting: Family Medicine

## 2019-05-09 ENCOUNTER — Other Ambulatory Visit: Payer: Self-pay

## 2019-05-09 ENCOUNTER — Encounter: Payer: Self-pay | Admitting: Family Medicine

## 2019-05-09 VITALS — BP 109/77 | HR 84 | Temp 97.3°F | Ht 66.0 in | Wt 173.6 lb

## 2019-05-09 DIAGNOSIS — K219 Gastro-esophageal reflux disease without esophagitis: Secondary | ICD-10-CM

## 2019-05-09 DIAGNOSIS — E785 Hyperlipidemia, unspecified: Secondary | ICD-10-CM

## 2019-05-09 DIAGNOSIS — E669 Obesity, unspecified: Secondary | ICD-10-CM

## 2019-05-09 DIAGNOSIS — J301 Allergic rhinitis due to pollen: Secondary | ICD-10-CM

## 2019-05-09 MED ORDER — PANTOPRAZOLE SODIUM 40 MG PO TBEC: 40.0000 mg | DELAYED_RELEASE_TABLET | Freq: Every day | ORAL | 3 refills | Status: DC

## 2019-05-14 MED ORDER — PHENTERMINE HCL 30 MG PO CAPS: 30.0000 mg | ORAL_CAPSULE | ORAL | 0 refills | Status: DC

## 2019-06-12 ENCOUNTER — Other Ambulatory Visit (HOSPITAL_COMMUNITY): Payer: Self-pay | Admitting: Optometry

## 2019-06-27 NOTE — Progress Notes (Signed)
Complete physical exam  I,Leah Burnett,acting as a scribe for Leah Mans, MD.,have documented all relevant documentation on the behalf of Leah Mans, MD,as directed by  Leah Mans, MD while in the presence of Leah Mans, MD.   Patient: Leah Burnett   DOB: 09-15-1970   48 y.o. Female  MRN: [id number] Visit Date: 06/28/2019  Today's healthcare provider: [name], MD   Chief Complaint  Patient presents with  . Annual Exam   Subjective    Leah Burnett is a 49 y.o. female who presents today for a complete physical exam.  She reports consuming a general diet. Home exercise routine includes some walking. She generally feels well. She reports sleeping fairly well. She does not have additional problems to discuss today.  She is feeling well and has no complaints.  Rosacea is controlled by topical treatments over-the-counter. HPI    Past Medical History:  Diagnosis Date  . Duodenitis   . Endometriosis 2002  . External thrombosed hemorrhoids 2009  . Hypertension    Past Surgical History:  Procedure Laterality Date  . ABDOMINAL HYSTERECTOMY    . CHOLECYSTECTOMY  2009  . ovary     right  . OVARY SURGERY  2002   Social History   Socioeconomic History  . Marital status: Married    Spouse name: Not on file  . Number of children: Not on file  . Years of education: Not on file  . Highest education level: Not on file  Occupational History  . Not on file  Tobacco Use  . Smoking status: Never Smoker  . Smokeless tobacco: Never Used  Vaping Use  . Vaping Use: Never used  Substance and Sexual Activity  . Alcohol use: Never    Alcohol/week: 0.0 standard drinks    Comment: very rarely  . Drug use: No  . Sexual activity: Not on file  Other Topics Concern  . Not on file  Social History Narrative  . Not on file   Social Determinants of Health   Financial Resource Strain:   . Difficulty of Paying Living Expenses:   Food  Insecurity:   . Worried About Programme researcher, broadcasting/film/video in the Last Year:   . Barista in the Last Year:   Transportation Needs:   . Freight forwarder (Medical):   Marland Kitchen Lack of Transportation (Non-Medical):   Physical Activity:   . Days of Exercise per Week:   . Minutes of Exercise per Session:   Stress:   . Feeling of Stress :   Social Connections:   . Frequency of Communication with Friends and Family:   . Frequency of Social Gatherings with Friends and Family:   . Attends Religious Services:   . Active Member of Clubs or Organizations:   . Attends Banker Meetings:   Marland Kitchen Marital Status:   Intimate Partner Violence:   . Fear of Current or Ex-Partner:   . Emotionally Abused:   Marland Kitchen Physically Abused:   . Sexually Abused:    Family Status  Relation Name Status  . Mother  Alive  . Father  Alive  . Brother  Alive  . Brother  Alive  . PGM  (Not Specified)  . PGF  (Not Specified)  . Neg Hx  (Not Specified)   Family History  Problem Relation Age of Onset  . Hyperlipidemia Mother   . Anemia Mother   . Cirrhosis Mother   .  Hypertension Father   . Heart disease Father        MI with bypass  . Hyperlipidemia Father   . Stroke Father   . Stroke Paternal Grandmother   . Stroke Paternal Grandfather   . Breast cancer Neg Hx    No Known Allergies  Patient Care Team: Maple Hudson., MD as PCP - General (Family Medicine)   Medications: Outpatient Medications Prior to Visit  Medication Sig  . Azelastine-Fluticasone 137-50 MCG/ACT SUSP Place 1 spray into both nostrils 2 (two) times daily.  . clindamycin (CLEOCIN T) 1 % external solution   . doxycycline (VIBRA-TABS) 100 MG tablet Take 1 tablet (100 mg total) by mouth 2 (two) times daily.  Marland Kitchen FINACEA 15 % FOAM   . hydrochlorothiazide (HYDRODIURIL) 12.5 MG tablet Take 1 tablet (12.5 mg total) by mouth daily.  . montelukast (SINGULAIR) 10 MG tablet Take 1 tablet (10 mg total) by mouth at bedtime.  .  pantoprazole (PROTONIX) 40 MG tablet Take 1 tablet (40 mg total) by mouth daily.  . phentermine 30 MG capsule Take 1 capsule (30 mg total) by mouth every morning.  Marland Kitchen CINNAMON PO Take by mouth. (Patient not taking: Reported on 06/28/2019)  . doxycycline (VIBRAMYCIN) 100 MG capsule Take 1 capsule (100 mg total) by mouth 2 (two) times daily. (Patient not taking: Reported on 12/30/2017)  . fluticasone (FLONASE) 50 MCG/ACT nasal spray Place into both nostrils daily. (Patient not taking: Reported on 06/28/2019)  . HYDROcodone-acetaminophen (NORCO/VICODIN) 5-325 MG tablet Take 1 tablet by mouth every 4 (four) hours as needed for moderate pain. (Patient not taking: Reported on 12/30/2017)  . KRILL OIL PO Take 2 tablets by mouth daily. (Patient not taking: Reported on 06/28/2019)  . minocycline (MINOCIN) 100 MG capsule Take by mouth daily. (Patient not taking: Reported on 06/28/2019)  . promethazine (PHENERGAN) 25 MG tablet Take 1 tablet (25 mg total) by mouth every 6 (six) hours as needed for nausea or vomiting. (Patient not taking: Reported on 12/30/2017)  . propranolol (INDERAL) 40 MG tablet Take 1 tablet (40 mg total) by mouth daily. (Patient not taking: Reported on 07/04/2017)  . Vitamin D, Ergocalciferol, (DRISDOL) 50000 units CAPS capsule  (Patient not taking: Reported on 06/28/2019)   No facility-administered medications prior to visit.    Review of Systems  All other systems reviewed and are negative.      Objective    BP 119/81 (BP Location: Left Arm, Patient Position: Sitting, Cuff Size: Large)   Pulse 93   Temp (!) 97.3 F (36.3 C) (Other (Comment))   Resp 16   Ht 5\' 6"  (1.676 m)   Wt 176 lb (79.8 kg)   SpO2 97%   BMI 28.41 kg/m  Wt Readings from Last 3 Encounters:  06/28/19 176 lb (79.8 kg)  05/09/19 173 lb 9.6 oz (78.7 kg)  11/09/18 170 lb (77.1 kg)      Physical Exam Vitals and nursing note reviewed. Exam conducted with a chaperone present.  Constitutional:      Appearance:  Normal appearance.  HENT:     Head:     Jaw: There is normal jaw occlusion.     Right Ear: Tympanic membrane normal.     Left Ear: Tympanic membrane normal.     Mouth/Throat:     Mouth: Mucous membranes are moist.     Pharynx: Oropharynx is clear.  Eyes:     Conjunctiva/sclera: Conjunctivae normal.  Cardiovascular:     Rate and Rhythm: Normal  rate and regular rhythm.     Pulses: Normal pulses.     Heart sounds: Normal heart sounds.  Pulmonary:     Effort: Pulmonary effort is normal.     Breath sounds: Normal breath sounds.  Chest:     Breasts:        Right: Normal.        Left: Normal.  Abdominal:     General: Abdomen is flat. Bowel sounds are normal.     Palpations: Abdomen is soft.  Musculoskeletal:        General: Normal range of motion.     Cervical back: Normal range of motion and neck supple.  Skin:    General: Skin is warm and dry.  Neurological:     General: No focal deficit present.     Mental Status: She is alert.  Psychiatric:        Attention and Perception: Attention and perception normal.        Mood and Affect: Mood normal.        Speech: Speech normal.        Behavior: Behavior normal. Behavior is cooperative.        Thought Content: Thought content normal.        Cognition and Memory: Cognition and memory normal.        Judgment: Judgment normal.       Last depression screening scores PHQ 2/9 Scores 06/28/2019 06/08/2018 12/30/2017  PHQ - 2 Score 0 0 0  PHQ- 9 Score 2 - -   Last fall risk screening Fall Risk  12/30/2017  Falls in the past year? 0   Last Audit-C alcohol use screening Alcohol Use Disorder Test (AUDIT) 06/28/2019  1. How often do you have a drink containing alcohol? 0  2. How many drinks containing alcohol do you have on a typical day when you are drinking? 0  3. How often do you have six or more drinks on one occasion? 0  AUDIT-C Score 0  Alcohol Brief Interventions/Follow-up -   A score of 3 or more in women, and 4 or more in  men indicates increased risk for alcohol abuse, EXCEPT if all of the points are from question 1   No results found for any visits on 06/28/19.  Assessment & Plan    Routine Health Maintenance and Physical Exam  Exercise Activities and Dietary recommendations Goals   None     Immunization History  Administered Date(s) Administered  . H1N1 09/30/2009  . Influenza-Unspecified 10/05/2014, 09/19/2018  . Tdap 06/28/2019    Health Maintenance  Topic Date Due  . Hepatitis C Screening  Never done  . COVID-19 Vaccine (1) Never done  . HIV Screening  Never done  . COLONOSCOPY  Never done  . INFLUENZA VACCINE  08/05/2019  . MAMMOGRAM  08/06/2019  . TETANUS/TDAP  06/27/2029    Discussed health benefits of physical activity, and encouraged her to engage in regular exercise appropriate for her age and condition.  1. Annual physical exam  - CBC w/Diff/Platelet - Comprehensive Metabolic Panel (CMET) - Lipid panel - TSH  2. Hyperlipidemia, unspecified hyperlipidemia type  - CBC w/Diff/Platelet - Comprehensive Metabolic Panel (CMET) - Lipid panel - TSH  3. Essential hypertension  - CBC w/Diff/Platelet - Comprehensive Metabolic Panel (CMET) - Lipid panel - TSH  4. Obesity without serious comorbidity, unspecified classification, unspecified obesity type  - CBC w/Diff/Platelet - Comprehensive Metabolic Panel (CMET) - Lipid panel - TSH  5. Gastroesophageal reflux disease,  unspecified whether esophagitis present  - CBC w/Diff/Platelet - Comprehensive Metabolic Panel (CMET) - Lipid panel - TSH  6. Allergic rhinitis due to pollen, unspecified seasonality  - CBC w/Diff/Platelet - Comprehensive Metabolic Panel (CMET) - Lipid panel - TSH  8. Need for tetanus, diphtheria, and acellular pertussis (Tdap) vaccine in patient of adolescent age or older  - Tdap vaccine greater than or equal to 7yo IM    Return in 53 weeks (on 07/03/2020) for CPE.     I, Wilhemena Durie, MD, have reviewed all documentation for this visit. The documentation on 07/04/19 for the exam, diagnosis, procedures, and orders are all accurate and complete.    Shirlene Andaya Cranford Mon, MD  [facility] [phone] (phone) [phone] (fax)  [place]

## 2019-06-28 ENCOUNTER — Encounter: Payer: Self-pay | Admitting: Family Medicine

## 2019-06-28 ENCOUNTER — Ambulatory Visit (INDEPENDENT_AMBULATORY_CARE_PROVIDER_SITE_OTHER): Payer: No Typology Code available for payment source | Admitting: Family Medicine

## 2019-06-28 ENCOUNTER — Other Ambulatory Visit: Payer: Self-pay

## 2019-06-28 VITALS — BP 119/81 | HR 93 | Temp 97.3°F | Resp 16 | Ht 66.0 in | Wt 176.0 lb

## 2019-06-28 DIAGNOSIS — E785 Hyperlipidemia, unspecified: Secondary | ICD-10-CM

## 2019-06-28 DIAGNOSIS — Z0000 Encounter for general adult medical examination without abnormal findings: Secondary | ICD-10-CM

## 2019-06-28 DIAGNOSIS — E669 Obesity, unspecified: Secondary | ICD-10-CM | POA: Diagnosis not present

## 2019-06-28 DIAGNOSIS — Z23 Encounter for immunization: Secondary | ICD-10-CM | POA: Diagnosis not present

## 2019-06-28 DIAGNOSIS — J301 Allergic rhinitis due to pollen: Secondary | ICD-10-CM

## 2019-06-28 DIAGNOSIS — I10 Essential (primary) hypertension: Secondary | ICD-10-CM | POA: Diagnosis not present

## 2019-06-28 DIAGNOSIS — K219 Gastro-esophageal reflux disease without esophagitis: Secondary | ICD-10-CM

## 2019-07-18 ENCOUNTER — Ambulatory Visit
Admission: RE | Admit: 2019-07-18 | Discharge: 2019-07-18 | Disposition: A | Discharge status: Home / Self Care | Payer: No Typology Code available for payment source | Source: Ambulatory Visit | Attending: Family Medicine | Admitting: Family Medicine

## 2019-07-18 DIAGNOSIS — Z1231 Encounter for screening mammogram for malignant neoplasm of breast: Secondary | ICD-10-CM | POA: Insufficient documentation

## 2019-07-19 LAB — TSH: TSH: 2.01 u[IU]/mL (ref 0.450–4.500)

## 2019-07-19 LAB — CBC WITH DIFFERENTIAL/PLATELET
Basophils Absolute: 0 10*3/uL (ref 0.0–0.2)
Basos: 0 %
EOS (ABSOLUTE): 0.1 10*3/uL (ref 0.0–0.4)
Eos: 2 %
Hematocrit: 39.3 % (ref 34.0–46.6)
Hemoglobin: 13.1 g/dL (ref 11.1–15.9)
Immature Grans (Abs): 0 10*3/uL (ref 0.0–0.1)
Immature Granulocytes: 0 %
Lymphocytes Absolute: 1.8 10*3/uL (ref 0.7–3.1)
Lymphs: 32 %
MCH: 28.1 pg (ref 26.6–33.0)
MCHC: 33.3 g/dL (ref 31.5–35.7)
MCV: 84 fL (ref 79–97)
Monocytes Absolute: 0.4 10*3/uL (ref 0.1–0.9)
Monocytes: 8 %
Neutrophils Absolute: 3.2 10*3/uL (ref 1.4–7.0)
Neutrophils: 58 %
Platelets: 373 10*3/uL (ref 150–450)
RBC: 4.66 x10E6/uL (ref 3.77–5.28)
RDW: 13.4 % (ref 11.7–15.4)
WBC: 5.5 10*3/uL (ref 3.4–10.8)

## 2019-07-19 LAB — LIPID PANEL
Chol/HDL Ratio: 5.2 ratio — ABNORMAL HIGH (ref 0.0–4.4)
Cholesterol, Total: 225 mg/dL — ABNORMAL HIGH (ref 100–199)
HDL: 43 mg/dL (ref >39)
LDL Chol Calc (NIH): 161 mg/dL — ABNORMAL HIGH (ref 0–99)
Triglycerides: 118 mg/dL (ref 0–149)
VLDL Cholesterol Cal: 21 mg/dL (ref 5–40)

## 2019-07-19 LAB — COMPREHENSIVE METABOLIC PANEL
ALT: 29 IU/L (ref 0–32)
AST: 20 IU/L (ref 0–40)
Albumin/Globulin Ratio: 2 (ref 1.2–2.2)
Albumin: 4.9 g/dL (ref 3.8–4.9)
Alkaline Phosphatase: 143 IU/L — ABNORMAL HIGH (ref 48–121)
BUN/Creatinine Ratio: 11 (ref 9–23)
BUN: 10 mg/dL (ref 6–24)
Bilirubin Total: 0.3 mg/dL (ref 0.0–1.2)
CO2: 23 mmol/L (ref 20–29)
Calcium: 9.7 mg/dL (ref 8.7–10.2)
Chloride: 104 mmol/L (ref 96–106)
Creatinine, Ser: 0.89 mg/dL (ref 0.57–1.00)
GFR calc Af Amer: 87 mL/min/{1.73_m2} (ref >59)
GFR calc non Af Amer: 75 mL/min/{1.73_m2} (ref >59)
Globulin, Total: 2.4 g/dL (ref 1.5–4.5)
Glucose: 99 mg/dL (ref 65–99)
Potassium: 4.3 mmol/L (ref 3.5–5.2)
Sodium: 141 mmol/L (ref 134–144)
Total Protein: 7.3 g/dL (ref 6.0–8.5)

## 2019-07-20 ENCOUNTER — Telehealth: Payer: Self-pay

## 2019-07-20 NOTE — Telephone Encounter (Signed)
-----   Message from Maple Hudson., MD sent at 07/20/2019  7:13 AM EDT ----- Labs stable.

## 2019-07-20 NOTE — Telephone Encounter (Signed)
Patient advised of lab results

## 2020-01-09 MED FILL — XIIDRA 5 % SOLN: 5 | 90 days supply | Qty: 180 | Fill #0

## 2020-03-10 MED FILL — PANTOPRAZOLE SOD DR 40 MG T: 40 | 30 days supply | Qty: 30 | Fill #0

## 2020-03-25 ENCOUNTER — Other Ambulatory Visit (HOSPITAL_BASED_OUTPATIENT_CLINIC_OR_DEPARTMENT_OTHER): Payer: Self-pay

## 2020-05-23 ENCOUNTER — Other Ambulatory Visit: Payer: Self-pay | Admitting: Family Medicine

## 2020-05-23 ENCOUNTER — Other Ambulatory Visit (HOSPITAL_COMMUNITY): Payer: Self-pay

## 2020-05-23 MED ORDER — PANTOPRAZOLE SODIUM 40 MG PO TBEC
DELAYED_RELEASE_TABLET | Freq: Every day | ORAL | 0 refills | Status: DC
  Filled 2020-05-23: qty 90, 90d supply, fill #0

## 2020-05-26 ENCOUNTER — Other Ambulatory Visit (HOSPITAL_COMMUNITY): Payer: Self-pay

## 2020-07-03 ENCOUNTER — Encounter: Payer: Self-pay | Admitting: Family Medicine

## 2020-10-01 ENCOUNTER — Telehealth: Payer: Self-pay

## 2020-10-01 NOTE — Telephone Encounter (Signed)
Copied from CRM [id number]. Topic: Referral - Request for Referral >> Oct 01, 2020 11:57 AM Gwenlyn Fudge wrote: Has patient seen PCP for this complaint? Yes.   *If NO, is insurance requiring patient see PCP for this issue before PCP can refer them? Referral for which specialty: Mammography Preferred provider/office: N/A Reason for referral: Pt called stating that she is needing to have her mammogram. She states that she is no longer a [employer] employee and is not sure where to have this done that accepts her insurance. Please advise.

## 2020-10-02 NOTE — Telephone Encounter (Signed)
Can discuss order at her appointment 10/08/2021. Patient has not not been seen in office since 06/28/2019. Okay for pec to advise patient.

## 2020-10-08 ENCOUNTER — Encounter: Payer: Self-pay | Admitting: Family Medicine

## 2020-10-08 ENCOUNTER — Ambulatory Visit (INDEPENDENT_AMBULATORY_CARE_PROVIDER_SITE_OTHER): Payer: BC Managed Care – PPO | Admitting: Family Medicine

## 2020-10-08 ENCOUNTER — Other Ambulatory Visit: Payer: Self-pay

## 2020-10-08 VITALS — BP 135/83 | HR 83 | Temp 98.6°F | Ht 65.0 in | Wt 177.0 lb

## 2020-10-08 DIAGNOSIS — E669 Obesity, unspecified: Secondary | ICD-10-CM | POA: Diagnosis not present

## 2020-10-08 DIAGNOSIS — Z0000 Encounter for general adult medical examination without abnormal findings: Secondary | ICD-10-CM | POA: Diagnosis not present

## 2020-10-08 DIAGNOSIS — K219 Gastro-esophageal reflux disease without esophagitis: Secondary | ICD-10-CM

## 2020-10-08 DIAGNOSIS — E785 Hyperlipidemia, unspecified: Secondary | ICD-10-CM | POA: Diagnosis not present

## 2020-10-08 DIAGNOSIS — I10 Essential (primary) hypertension: Secondary | ICD-10-CM | POA: Diagnosis not present

## 2020-10-08 DIAGNOSIS — J301 Allergic rhinitis due to pollen: Secondary | ICD-10-CM

## 2020-10-08 NOTE — Progress Notes (Signed)
Complete physical exam   Patient: Leah Burnett   DOB: 11-07-1970   50 y.o. Female  MRN: [id number] Visit Date: 10/08/2020  Today's healthcare provider: Wilhemena Durie, MD   Chief Complaint  Patient presents with   Annual Exam    Subjective    Leah Burnett is a 50 y.o. female who presents today for a complete physical exam.  She reports consuming a general diet. The patient has a physically strenuous job, but has no regular exercise apart from work.  She generally feels well. She reports sleeping well. She does not have additional problems to discuss today.  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  She is enjoying it.  She has had TAH for endometriosis.  She sees Dr. Phillip Heal for skin problems.  Past Medical History:  Diagnosis Date   Duodenitis    Endometriosis 2002   External thrombosed hemorrhoids 2009   Hypertension    Past Surgical History:  Procedure Laterality Date   ABDOMINAL HYSTERECTOMY     CHOLECYSTECTOMY  2009   ovary     right   OVARY SURGERY  2002   Social History   Socioeconomic History   Marital status: Married    Spouse name: Not on file   Number of children: Not on file   Years of education: Not on file   Highest education level: Not on file  Occupational History   Not on file  Tobacco Use   Smoking status: Never   Smokeless tobacco: Never  Vaping Use   Vaping Use: Never used  Substance and Sexual Activity   Alcohol use: Never    Alcohol/week: 0.0 standard drinks    Comment: very rarely   Drug use: No   Sexual activity: Not on file  Other Topics Concern   Not on file  Social History Narrative   Not on file   Social Determinants of Health   Financial Resource Strain: Not on file  Food Insecurity: Not on file  Transportation Needs: Not on file  Physical Activity: Not on file  Stress: Not on file  Social Connections: Not on file  Intimate Partner Violence: Not on  file   Family Status  Relation Name Status   Mother  Alive   Father  Alive   Brother  Alive   Brother  Alive   PGM  (Not Specified)   PGF  (Not Specified)   Neg Hx  (Not Specified)   Family History  Problem Relation Age of Onset   Hyperlipidemia Mother    Anemia Mother    Cirrhosis Mother    Hypertension Father    Heart disease Father        MI with bypass   Hyperlipidemia Father    Stroke Father    Prostate cancer Brother    Stroke Paternal Grandmother    Stroke Paternal Grandfather    Breast cancer Neg Hx    No Known Allergies  Patient Care Team: Jerrol Banana., MD as PCP - General (Family Medicine)   Medications: Outpatient Medications Prior to Visit  Medication Sig   fluticasone (FLONASE) 50 MCG/ACT nasal spray Place into both nostrils daily.   montelukast (SINGULAIR) 10 MG tablet Take 1 tablet (10 mg total) by mouth at bedtime.   pantoprazole (PROTONIX) 40 MG tablet TAKE 1 TABLET BY MOUTH DAILY.   Azelastine-Fluticasone 137-50 MCG/ACT SUSP Place 1 spray into both nostrils  2 (two) times daily.   CINNAMON PO Take by mouth. (Patient not taking: Reported on 06/28/2019)   clindamycin (CLEOCIN T) 1 % external solution    doxycycline (VIBRA-TABS) 100 MG tablet Take 1 tablet (100 mg total) by mouth 2 (two) times daily.   doxycycline (VIBRAMYCIN) 100 MG capsule Take 1 capsule (100 mg total) by mouth 2 (two) times daily. (Patient not taking: Reported on 12/30/2017)   FINACEA 15 % FOAM    hydrochlorothiazide (HYDRODIURIL) 12.5 MG tablet Take 1 tablet (12.5 mg total) by mouth daily. (Patient not taking: Reported on 10/08/2020)   HYDROcodone-acetaminophen (NORCO/VICODIN) 5-325 MG tablet Take 1 tablet by mouth every 4 (four) hours as needed for moderate pain. (Patient not taking: Reported on 12/30/2017)   KRILL OIL PO Take 2 tablets by mouth daily. (Patient not taking: Reported on 06/28/2019)   minocycline (MINOCIN) 100 MG capsule Take by mouth daily. (Patient not taking:  Reported on 06/28/2019)   pantoprazole (PROTONIX) 40 MG tablet Take 1 tablet (40 mg total) by mouth daily.   phentermine 30 MG capsule Take 1 capsule (30 mg total) by mouth every morning.   promethazine (PHENERGAN) 25 MG tablet Take 1 tablet (25 mg total) by mouth every 6 (six) hours as needed for nausea or vomiting. (Patient not taking: Reported on 12/30/2017)   propranolol (INDERAL) 40 MG tablet Take 1 tablet (40 mg total) by mouth daily. (Patient not taking: Reported on 07/04/2017)   Vitamin D, Ergocalciferol, (DRISDOL) 50000 units CAPS capsule  (Patient not taking: Reported on 06/28/2019)   No facility-administered medications prior to visit.    Review of Systems  Constitutional: Negative.   HENT: Negative.    Eyes: Negative.   Respiratory: Negative.    Cardiovascular: Negative.   Gastrointestinal: Negative.   Endocrine: Negative.   Genitourinary: Negative.   Musculoskeletal: Negative.   Skin: Negative.   Allergic/Immunologic: Negative.   Neurological: Negative.   Hematological: Negative.   Psychiatric/Behavioral: Negative.    All other systems reviewed and are negative.    Objective    BP 135/83 (BP Location: Right Arm, Patient Position: Sitting, Cuff Size: Large)   Pulse 83   Temp 98.6 F (37 C) (Oral)   Ht _0  (1.651 m)   Wt 177 lb (80.3 kg)   SpO2 97%   BMI 29.45 kg/m    Physical Exam Vitals and nursing note reviewed.  Constitutional:      Appearance: Normal appearance.  HENT:     Head:     Jaw: There is normal jaw occlusion.     Right Ear: Tympanic membrane normal.     Left Ear: Tympanic membrane normal.     Mouth/Throat:     Mouth: Mucous membranes are moist.     Pharynx: Oropharynx is clear.  Eyes:     Conjunctiva/sclera: Conjunctivae normal.  Cardiovascular:     Rate and Rhythm: Normal rate and regular rhythm.     Pulses: Normal pulses.     Heart sounds: Normal heart sounds.  Pulmonary:     Effort: Pulmonary effort is normal.     Breath sounds:  Normal breath sounds.  Chest:  Breasts:    Right: Normal.     Left: Normal.  Abdominal:     General: Bowel sounds are normal.     Palpations: Abdomen is soft.  Musculoskeletal:     Cervical back: Neck supple.  Skin:    General: Skin is warm and dry.  Neurological:     General: No focal  deficit present.     Mental Status: She is alert.  Psychiatric:        Attention and Perception: Attention and perception normal.        Mood and Affect: Mood normal.        Speech: Speech normal.        Behavior: Behavior normal. Behavior is cooperative.        Thought Content: Thought content normal.        Cognition and Memory: Cognition and memory normal.        Judgment: Judgment normal.      Last depression screening scores PHQ 2/9 Scores 10/08/2020 06/28/2019 06/08/2018  PHQ - 2 Score 0 0 0  PHQ- 9 Score 0 2 -   Last fall risk screening Fall Risk  10/08/2020  Falls in the past year? 0  Number falls in past yr: 0  Injury with Fall? 0  Risk for fall due to : No Fall Risks  Follow up Falls evaluation completed   Last Audit-C alcohol use screening Alcohol Use Disorder Test (AUDIT) 10/08/2020  1. How often do you have a drink containing alcohol? 0  2. How many drinks containing alcohol do you have on a typical day when you are drinking? 0  3. How often do you have six or more drinks on one occasion? 0  AUDIT-C Score 0  Alcohol Brief Interventions/Follow-up -   A score of 3 or more in women, and 4 or more in men indicates increased risk for alcohol abuse, EXCEPT if all of the points are from question 1   No results found for any visits on 10/08/20.  Assessment & Plan    Routine Health Maintenance and Physical Exam  Exercise Activities and Dietary recommendations  Goals   None     Immunization History  Administered Date(s) Administered   H1N1 09/30/2009   Hepatitis B, adult 09/20/1994, 10/18/1994, 04/18/1995   Influenza, High Dose Seasonal PF 01/18/2020   Influenza,inj,quad,  With Preservative 09/23/2019   Influenza-Unspecified 10/05/2014, 09/19/2018, 10/07/2020   MMR 08/22/1971   PFIZER(Purple Top)SARS-COV-2 Vaccination 01/01/2019, 01/22/2019, 06/11/2019, 01/18/2020   Tdap 06/28/2019    Health Maintenance  Topic Date Due   HIV Screening  Never done   Hepatitis C Screening  Never done   COLONOSCOPY (Pts 45-76yr Insurance coverage will need to be confirmed)  Never done   Zoster Vaccines- Shingrix (1 of 2) Never done   MAMMOGRAM  07/17/2021   TETANUS/TDAP  06/27/2029   INFLUENZA VACCINE  Completed   COVID-19 Vaccine  Completed   HPV VACCINES  Aged Out    Discussed health benefits of physical activity, and encouraged her to engage in regular exercise appropriate for her age and condition.  1. Annual physical exam Up-to-date.  Will be time for follow-up colonoscopy next year. - Lipid panel - TSH - CBC w/Diff/Platelet - Comprehensive Metabolic Panel (CMET)  2. Essential hypertension  - Lipid panel - TSH - CBC w/Diff/Platelet - Comprehensive Metabolic Panel (CMET)  3. Hyperlipidemia, unspecified hyperlipidemia type  - Lipid panel - TSH - CBC w/Diff/Platelet - Comprehensive Metabolic Panel (CMET)  4. Obesity without serious comorbidity, unspecified classification, unspecified obesity type Patient doing well with weight loss.  BMI down to 20 - Lipid panel - TSH - CBC w/Diff/Platelet - Comprehensive Metabolic Panel (CMET)  5. Gastroesophageal reflux disease, unspecified whether esophagitis present  - Lipid panel - TSH - CBC w/Diff/Platelet - Comprehensive Metabolic Panel (CMET)  6. Allergic rhinitis due to pollen, unspecified  seasonality  - Lipid panel - TSH - CBC w/Diff/Platelet - Comprehensive Metabolic Panel (CMET)   Return in about 1 year (around 10/08/2021).     I, Wilhemena Durie, MD, have reviewed all documentation for this visit. The documentation on 10/13/20 for the exam, diagnosis, procedures, and orders are all  accurate and complete.    Kameko Hukill Cranford Mon, MD  [facility] [phone] (phone) [phone] (fax)  [place]

## 2020-10-09 LAB — COMPREHENSIVE METABOLIC PANEL
ALT: 36 IU/L — ABNORMAL HIGH (ref 0–32)
AST: 26 IU/L (ref 0–40)
Albumin/Globulin Ratio: 1.7 (ref 1.2–2.2)
Albumin: 4.7 g/dL (ref 3.8–4.9)
Alkaline Phosphatase: 141 IU/L — ABNORMAL HIGH (ref 44–121)
BUN/Creatinine Ratio: 21 (ref 9–23)
BUN: 18 mg/dL (ref 6–24)
Bilirubin Total: 0.5 mg/dL (ref 0.0–1.2)
CO2: 25 mmol/L (ref 20–29)
Calcium: 10.2 mg/dL (ref 8.7–10.2)
Chloride: 101 mmol/L (ref 96–106)
Creatinine, Ser: 0.86 mg/dL (ref 0.57–1.00)
Globulin, Total: 2.7 g/dL (ref 1.5–4.5)
Glucose: 87 mg/dL (ref 70–99)
Potassium: 4.4 mmol/L (ref 3.5–5.2)
Sodium: 142 mmol/L (ref 134–144)
Total Protein: 7.4 g/dL (ref 6.0–8.5)
eGFR: 81 mL/min/{1.73_m2} (ref >59)

## 2020-10-09 LAB — CBC WITH DIFFERENTIAL/PLATELET
Basophils Absolute: 0 10*3/uL (ref 0.0–0.2)
Basos: 0 %
EOS (ABSOLUTE): 0.1 10*3/uL (ref 0.0–0.4)
Eos: 2 %
Hematocrit: 42.3 % (ref 34.0–46.6)
Hemoglobin: 13.9 g/dL (ref 11.1–15.9)
Immature Grans (Abs): 0 10*3/uL (ref 0.0–0.1)
Immature Granulocytes: 0 %
Lymphocytes Absolute: 1.7 10*3/uL (ref 0.7–3.1)
Lymphs: 25 %
MCH: 28.1 pg (ref 26.6–33.0)
MCHC: 32.9 g/dL (ref 31.5–35.7)
MCV: 86 fL (ref 79–97)
Monocytes Absolute: 0.5 10*3/uL (ref 0.1–0.9)
Monocytes: 7 %
Neutrophils Absolute: 4.4 10*3/uL (ref 1.4–7.0)
Neutrophils: 66 %
Platelets: 359 10*3/uL (ref 150–450)
RBC: 4.94 x10E6/uL (ref 3.77–5.28)
RDW: 13.2 % (ref 11.7–15.4)
WBC: 6.7 10*3/uL (ref 3.4–10.8)

## 2020-10-09 LAB — LIPID PANEL
Chol/HDL Ratio: 5.7 ratio — ABNORMAL HIGH (ref 0.0–4.4)
Cholesterol, Total: 251 mg/dL — ABNORMAL HIGH (ref 100–199)
HDL: 44 mg/dL (ref >39)
LDL Chol Calc (NIH): 182 mg/dL — ABNORMAL HIGH (ref 0–99)
Triglycerides: 135 mg/dL (ref 0–149)
VLDL Cholesterol Cal: 25 mg/dL (ref 5–40)

## 2020-10-09 LAB — TSH: TSH: 1.3 u[IU]/mL (ref 0.450–4.500)

## 2020-10-22 LAB — HM MAMMOGRAPHY

## 2020-11-06 ENCOUNTER — Encounter: Payer: Self-pay | Admitting: *Deleted

## 2020-11-28 ENCOUNTER — Other Ambulatory Visit (HOSPITAL_COMMUNITY): Payer: Self-pay

## 2020-11-28 ENCOUNTER — Other Ambulatory Visit: Payer: Self-pay

## 2020-12-01 ENCOUNTER — Other Ambulatory Visit (HOSPITAL_COMMUNITY): Payer: Self-pay

## 2020-12-01 MED ORDER — XIIDRA 5 % OP SOLN
OPHTHALMIC | 0 refills | Status: DC
  Filled 2020-12-01: qty 60, 30d supply, fill #0

## 2020-12-04 ENCOUNTER — Other Ambulatory Visit (HOSPITAL_COMMUNITY): Payer: Self-pay

## 2021-06-09 ENCOUNTER — Other Ambulatory Visit (HOSPITAL_COMMUNITY): Payer: Self-pay

## 2021-06-09 MED ORDER — XIIDRA 5 % OP SOLN
OPHTHALMIC | 6 refills | Status: DC
  Filled 2021-06-09: qty 180, 90d supply, fill #0

## 2021-08-06 NOTE — Progress Notes (Signed)
Established patient visit  I,Joseline E Rosas,acting as a scribe for [employer], PA-C.,have documented all relevant documentation on the behalf of Debera Lat, PA-C,as directed by  [employer], PA-C while in the presence of [employer], PA-C.   Patient: Leah Burnett   DOB: [birth year]   51 y.o. Female  MRN: [id number] Visit Date: 08/07/2021  Today's healthcare provider: Debera Lat, PA-C   Chief Complaint  Patient presents with   Fatigue   Subjective    Patient presents requesting her thyroid be checked and lab work.  Reports feeling tired.   Last was seen on 10/08/20 Fatigue  She reports new onset fatigue which she describes as a lack of energy and tired and achy all over . It began several months ago and occurs every day. It is described as moderate and gradually worsening. She has not started new medications around the time the fatigue started. She has also gained weight. Family hs of thyroid problems Reports unexpected weight gain within 8-12 weeks    Associated symptoms: Yes arthralgias No bleeding  No melena No chest discomfort  No heart palpitations No heart racing   No dyspnea No feeling depressed  Yes feeling anxious or under stress-sometimes No fevers  No loss of appetite No nausea  No vomiting Yes sleeping problems    Wt Readings from Last 3 Encounters:  08/07/21 184 lb 11.2 oz (83.8 kg)  10/08/20 177 lb (80.3 kg)  06/28/19 176 lb (79.8 kg)    Lab Results  Component Value Date   WBC 6.7 10/08/2020   HGB 13.9 10/08/2020   HCT 42.3 10/08/2020   MCV 86 10/08/2020   PLT 359 10/08/2020   Lab Results  Component Value Date   TSH 1.300 10/08/2020   Lab Results  Component Value Date   NA 142 10/08/2020   K 4.4 10/08/2020   CO2 25 10/08/2020   BUN 18 10/08/2020   CREATININE 0.86 10/08/2020   CALCIUM 10.2 10/08/2020   GLUCOSE 87 10/08/2020      ---------------------------------------------------------------------------------------------------   Medications: Outpatient Medications Prior to Visit  Medication Sig   Azelastine-Fluticasone 137-50 MCG/ACT SUSP Place 1 spray into both nostrils 2 (two) times daily.   FINACEA 15 % FOAM    fluticasone (FLONASE) 50 MCG/ACT nasal spray Place into both nostrils daily.   Lifitegrast (XIIDRA) 5 % SOLN PLACE 1 DROP IN BOTH EYES TWO TIMES DAILY   Lifitegrast (XIIDRA) 5 % SOLN Place 1 drop into both eyes twice a day   montelukast (SINGULAIR) 10 MG tablet Take 1 tablet (10 mg total) by mouth at bedtime.   pantoprazole (PROTONIX) 40 MG tablet Take 1 tablet (40 mg total) by mouth daily.   phentermine 30 MG capsule Take 1 capsule (30 mg total) by mouth every morning.   [DISCONTINUED] hydrochlorothiazide (HYDRODIURIL) 12.5 MG tablet Take 1 tablet (12.5 mg total) by mouth daily.   CINNAMON PO Take by mouth. (Patient not taking: Reported on 06/28/2019)   clindamycin (CLEOCIN T) 1 % external solution    doxycycline (VIBRA-TABS) 100 MG tablet Take 1 tablet (100 mg total) by mouth 2 (two) times daily.   doxycycline (VIBRAMYCIN) 100 MG capsule Take 1 capsule (100 mg total) by mouth 2 (two) times daily. (Patient not taking: Reported on 12/30/2017)   HYDROcodone-acetaminophen (NORCO/VICODIN) 5-325 MG tablet Take 1 tablet by mouth every 4 (four) hours as needed for moderate pain. (Patient not taking: Reported on 12/30/2017)   KRILL OIL PO Take 2 tablets  by mouth daily. (Patient not taking: Reported on 06/28/2019)   minocycline (MINOCIN) 100 MG capsule Take by mouth daily. (Patient not taking: Reported on 06/28/2019)   pantoprazole (PROTONIX) 40 MG tablet TAKE 1 TABLET BY MOUTH DAILY.   promethazine (PHENERGAN) 25 MG tablet Take 1 tablet (25 mg total) by mouth every 6 (six) hours as needed for nausea or vomiting. (Patient not taking: Reported on 12/30/2017)   propranolol (INDERAL) 40 MG tablet Take 1 tablet (40 mg  total) by mouth daily. (Patient not taking: Reported on 07/04/2017)   Vitamin D, Ergocalciferol, (DRISDOL) 50000 units CAPS capsule  (Patient not taking: Reported on 06/28/2019)   No facility-administered medications prior to visit.    Review of Systems  Respiratory: Negative.    Cardiovascular: Negative.   Gastrointestinal: Negative.   All other systems reviewed and are negative. See hpi     Objective    BP (!) 144/100 (BP Location: Right Arm, Patient Position: Sitting, Cuff Size: Normal)   Temp 97.7 F (36.5 C) (Oral)   Resp 16   Ht 5\' 5"  (1.651 m)   Wt 184 lb 11.2 oz (83.8 kg)   BMI 30.74 kg/m    Physical Exam Vitals reviewed.  Constitutional:      General: She is not in acute distress.    Appearance: Normal appearance. She is well-developed. She is obese. She is not diaphoretic.  HENT:     Head: Normocephalic and atraumatic.     Nose: Nose normal.  Eyes:     General: No scleral icterus.    Extraocular Movements: Extraocular movements intact.     Conjunctiva/sclera: Conjunctivae normal.     Pupils: Pupils are equal, round, and reactive to light.  Neck:     Thyroid: No thyromegaly.  Cardiovascular:     Rate and Rhythm: Normal rate and regular rhythm.     Pulses: Normal pulses.     Heart sounds: Normal heart sounds. No murmur heard. Pulmonary:     Effort: Pulmonary effort is normal. No respiratory distress.     Breath sounds: Normal breath sounds. No wheezing, rhonchi or rales.  Musculoskeletal:     Cervical back: Neck supple.     Right lower leg: No edema.     Left lower leg: No edema.  Lymphadenopathy:     Cervical: No cervical adenopathy.  Skin:    General: Skin is warm and dry.     Findings: No rash.  Neurological:     Mental Status: She is alert and oriented to person, place, and time. Mental status is at baseline.  Psychiatric:        Behavior: Behavior normal.        Thought Content: Thought content normal.        Judgment: Judgment normal.       No results found for any visits on 08/07/21.  Assessment & Plan     1. Obesity without serious comorbidity, unspecified classification, unspecified obesity type BMI 30.74 - CBC with Differential/Platelet - Comprehensive metabolic panel - Lipid panel - TSH - hydrochlorothiazide (HYDRODIURIL) 25 MG tablet; Take 1 tablet (25 mg total) by mouth daily.  Dispense: 90 tablet; Refill: 3 Lifestyle modification are discusses and advised  2. Essential hypertension BP 144/100, not at goal - hydrochlorothiazide (HYDRODIURIL) 25 MG tablet; Take 1 tablet (25 mg total) by mouth daily.  Dispense: 90 tablet; Refill: 3 - valsartan (DIOVAN) 80 MG tablet; Take 1 tablet (80 mg total) by mouth daily.  Dispense: 90 tablet; Refill: 3  Requested to measure her BP at home  3. Gastroesophageal reflux disease, unspecified whether esophagitis present Continue PPI Elevate the head of the bed 6-8 inches, avoid recumbency for 3 hours after eating, avoid food as a delayed gastric emptying, weight loss    4. Allergic rhinitis due to pollen, unspecified seasonality Symptomatic treatment: OTC antihistamines, flonase, nasal saline rinse   5. Other fatigue Requested to check TSH/positive family history of thyroid disorders FU as scheduled    The patient was advised to call back or seek an in-person evaluation if the symptoms worsen or if the condition fails to improve as anticipated.  I discussed the assessment and treatment plan with the patient. The patient was provided an opportunity to ask questions and all were answered. The patient agreed with the plan and demonstrated an understanding of the instructions.  The entirety of the information documented in the History of Present Illness, Review of Systems and Physical Exam were personally obtained by me. Portions of this information were initially documented by the CMA and reviewed by me for thoroughness and accuracy.  Portions of this note were created using  dictation software and may contain typographical errors.        Total encounter time more than 30 minutes  Greater than 50% was spent in counseling and coordination of care with the patient   Cherlynn Polo  [facility] [phone] (phone) [phone] (fax)  [facility]

## 2021-08-07 ENCOUNTER — Encounter: Payer: Self-pay | Admitting: Physician Assistant

## 2021-08-07 ENCOUNTER — Ambulatory Visit (INDEPENDENT_AMBULATORY_CARE_PROVIDER_SITE_OTHER): Payer: BC Managed Care – PPO | Admitting: Physician Assistant

## 2021-08-07 ENCOUNTER — Other Ambulatory Visit: Payer: Self-pay

## 2021-08-07 VITALS — BP 144/100 | Temp 97.7°F | Resp 16 | Ht 65.0 in | Wt 184.7 lb

## 2021-08-07 DIAGNOSIS — I10 Essential (primary) hypertension: Secondary | ICD-10-CM | POA: Diagnosis not present

## 2021-08-07 DIAGNOSIS — K219 Gastro-esophageal reflux disease without esophagitis: Secondary | ICD-10-CM | POA: Diagnosis not present

## 2021-08-07 DIAGNOSIS — E669 Obesity, unspecified: Secondary | ICD-10-CM

## 2021-08-07 DIAGNOSIS — R5383 Other fatigue: Secondary | ICD-10-CM

## 2021-08-07 DIAGNOSIS — J301 Allergic rhinitis due to pollen: Secondary | ICD-10-CM | POA: Diagnosis not present

## 2021-08-07 MED ORDER — VALSARTAN 80 MG PO TABS
80.0000 mg | ORAL_TABLET | Freq: Every day | ORAL | 3 refills | Status: DC
  Filled 2021-08-07: qty 90, 90d supply, fill #0

## 2021-08-07 MED ORDER — HYDROCHLOROTHIAZIDE 25 MG PO TABS
25.0000 mg | ORAL_TABLET | Freq: Every day | ORAL | 3 refills | Status: DC
Start: 2021-08-07 — End: 2022-08-27
  Filled 2021-08-07 – 2021-11-16 (×2): qty 90, 90d supply, fill #0
  Filled 2022-04-12: qty 90, 90d supply, fill #1

## 2021-08-08 LAB — TSH: TSH: 1.48 u[IU]/mL (ref 0.450–4.500)

## 2021-08-08 LAB — CBC WITH DIFFERENTIAL/PLATELET
Basophils Absolute: 0 10*3/uL (ref 0.0–0.2)
Basos: 1 %
EOS (ABSOLUTE): 0.1 10*3/uL (ref 0.0–0.4)
Eos: 2 %
Hematocrit: 40.6 % (ref 34.0–46.6)
Hemoglobin: 13.3 g/dL (ref 11.1–15.9)
Immature Grans (Abs): 0 10*3/uL (ref 0.0–0.1)
Immature Granulocytes: 0 %
Lymphocytes Absolute: 2.3 10*3/uL (ref 0.7–3.1)
Lymphs: 37 %
MCH: 28.4 pg (ref 26.6–33.0)
MCHC: 32.8 g/dL (ref 31.5–35.7)
MCV: 87 fL (ref 79–97)
Monocytes Absolute: 0.5 10*3/uL (ref 0.1–0.9)
Monocytes: 7 %
Neutrophils Absolute: 3.4 10*3/uL (ref 1.4–7.0)
Neutrophils: 53 %
Platelets: 364 10*3/uL (ref 150–450)
RBC: 4.69 x10E6/uL (ref 3.77–5.28)
RDW: 13.3 % (ref 11.7–15.4)
WBC: 6.3 10*3/uL (ref 3.4–10.8)

## 2021-08-08 LAB — COMPREHENSIVE METABOLIC PANEL
ALT: 40 IU/L — ABNORMAL HIGH (ref 0–32)
AST: 21 IU/L (ref 0–40)
Albumin/Globulin Ratio: 1.7 (ref 1.2–2.2)
Albumin: 4.6 g/dL (ref 3.8–4.9)
Alkaline Phosphatase: 149 IU/L — ABNORMAL HIGH (ref 44–121)
BUN/Creatinine Ratio: 11 (ref 9–23)
BUN: 10 mg/dL (ref 6–24)
Bilirubin Total: 0.4 mg/dL (ref 0.0–1.2)
CO2: 24 mmol/L (ref 20–29)
Calcium: 8.5 mg/dL — ABNORMAL LOW (ref 8.7–10.2)
Chloride: 104 mmol/L (ref 96–106)
Creatinine, Ser: 0.88 mg/dL (ref 0.57–1.00)
Globulin, Total: 2.7 g/dL (ref 1.5–4.5)
Glucose: 92 mg/dL (ref 70–99)
Potassium: 4.5 mmol/L (ref 3.5–5.2)
Sodium: 142 mmol/L (ref 134–144)
Total Protein: 7.3 g/dL (ref 6.0–8.5)
eGFR: 79 mL/min/{1.73_m2} (ref >59)

## 2021-08-08 LAB — LIPID PANEL
Chol/HDL Ratio: 5.9 ratio — ABNORMAL HIGH (ref 0.0–4.4)
Cholesterol, Total: 246 mg/dL — ABNORMAL HIGH (ref 100–199)
HDL: 42 mg/dL (ref >39)
LDL Chol Calc (NIH): 180 mg/dL — ABNORMAL HIGH (ref 0–99)
Triglycerides: 131 mg/dL (ref 0–149)
VLDL Cholesterol Cal: 24 mg/dL (ref 5–40)

## 2021-08-10 NOTE — Telephone Encounter (Signed)
This encounter was created in error - please disregard.

## 2021-08-20 NOTE — Progress Notes (Unsigned)
Established patient visit   Patient: Leah Burnett   DOB: 04/26/1970   51 y.o. Female  MRN: [id number] Visit Date: 08/24/2021  Today's healthcare provider: Wilhemena Durie, MD   No chief complaint on file.  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  She states she has been unable to lose weight.  She had some success in the past on phentermine and that might be the best fatigue issue off of the stimulant.   Hypertension, follow-up  BP Readings from Last 3 Encounters:  08/24/21 107/74  08/07/21 (!) 144/100  10/08/20 135/83   Wt Readings from Last 3 Encounters:  08/24/21 184 lb (83.5 kg)  08/07/21 184 lb 11.2 oz (83.8 kg)  10/08/20 177 lb (80.3 kg)     She was last seen for hypertension 2 weeks ago.  BP at that visit was as above. Management since that visit includes starting  HCTZ.  Outside blood pressures are 120's-130's over 90's. Symptoms: No chest pain No chest pressure  No palpitations No syncope  No dyspnea No orthopnea  No paroxysmal nocturnal dyspnea No lower extremity edema   Pertinent labs Lab Results  Component Value Date   CHOL 246 (H) 08/07/2021   HDL 42 08/07/2021   LDLCALC 180 (H) 08/07/2021   TRIG 131 08/07/2021   CHOLHDL 5.9 (H) 08/07/2021   Lab Results  Component Value Date   NA 142 08/07/2021   K 4.5 08/07/2021   CREATININE 0.88 08/07/2021   EGFR 79 08/07/2021   GLUCOSE 92 08/07/2021   TSH 1.480 08/07/2021     The 10-year ASCVD risk score (Arnett DK, et al., 2019) is: 2.6%  --------------------------------------------------------------------------------------------------- Patient would like to discuss her weight.  She would like to do something about getting it down.  Patient also complains of swelling in her hands.  She has  fatigue but states it is a little better.   Medications: Outpatient Medications Prior to Visit  Medication Sig   hydrochlorothiazide (HYDRODIURIL) 25 MG tablet Take 1 tablet (25 mg total) by mouth daily.   pantoprazole (PROTONIX) 40 MG tablet Take 1 tablet (40 mg total) by mouth daily.   valsartan (DIOVAN) 80 MG tablet Take 1 tablet (80 mg total) by mouth daily.   [DISCONTINUED] montelukast (SINGULAIR) 10 MG tablet Take 1 tablet (10 mg total) by mouth at bedtime.   [DISCONTINUED] Azelastine-Fluticasone 137-50 MCG/ACT SUSP Place 1 spray into both nostrils 2 (two) times daily.   [DISCONTINUED] FINACEA 15 % FOAM    [DISCONTINUED] fluticasone (FLONASE) 50 MCG/ACT nasal spray Place into both nostrils daily.   [DISCONTINUED] HYDROcodone-acetaminophen (NORCO/VICODIN) 5-325 MG tablet Take 1 tablet by mouth every 4 (four) hours as needed for moderate pain. (Patient not taking: Reported on 12/30/2017)   [DISCONTINUED] KRILL OIL PO Take 2 tablets by mouth daily. (Patient not taking: Reported on 06/28/2019)   [DISCONTINUED] Lifitegrast (XIIDRA) 5 % SOLN PLACE 1 DROP IN BOTH EYES TWO TIMES DAILY   [DISCONTINUED] Lifitegrast (XIIDRA) 5 % SOLN Place 1 drop into both eyes twice a day   [DISCONTINUED] minocycline (MINOCIN) 100 MG capsule Take by mouth daily. (Patient not taking: Reported on 06/28/2019)   [DISCONTINUED] pantoprazole (PROTONIX) 40 MG tablet TAKE 1 TABLET BY MOUTH DAILY.   [  DISCONTINUED] phentermine 30 MG capsule Take 1 capsule (30 mg total) by mouth every morning.   No facility-administered medications prior to visit.    Review of Systems  Constitutional:  Positive for fatigue.  Neurological:  Positive for headaches. Negative for dizziness.    Last thyroid functions Lab Results  Component Value Date   TSH 1.480 08/07/2021       Objective    BP 107/74 (BP Location: Right Arm, Patient Position: Sitting, Cuff Size: Normal)   Pulse 89   Temp 98.1 F (36.7 C) (Oral)   Wt 184 lb (83.5  kg)   SpO2 98%   BMI 30.62 kg/m  BP Readings from Last 3 Encounters:  08/24/21 107/74  08/07/21 (!) 144/100  10/08/20 135/83   Wt Readings from Last 3 Encounters:  08/24/21 184 lb (83.5 kg)  08/07/21 184 lb 11.2 oz (83.8 kg)  10/08/20 177 lb (80.3 kg)      Physical Exam Vitals reviewed.  Constitutional:      Appearance: She is well-developed.  HENT:     Head: Normocephalic and atraumatic.     Right Ear: External ear normal.     Left Ear: External ear normal.     Nose: Nose normal.  Eyes:     General: No scleral icterus.    Conjunctiva/sclera: Conjunctivae normal.     Pupils: Pupils are equal, round, and reactive to light.  Neck:     Thyroid: Thyromegaly present.  Cardiovascular:     Rate and Rhythm: Normal rate and regular rhythm.     Heart sounds: Normal heart sounds.  Pulmonary:     Effort: Pulmonary effort is normal.     Breath sounds: Normal breath sounds.  Abdominal:     Palpations: Abdomen is soft.  Lymphadenopathy:     Cervical: No cervical adenopathy.  Skin:    General: Skin is warm and dry.  Neurological:     Mental Status: She is alert and oriented to person, place, and time.  Psychiatric:        Behavior: Behavior normal.        Thought Content: Thought content normal.        Judgment: Judgment normal.       No results found for any visits on 08/24/21.  Assessment & Plan     1. Essential hypertension Good blood pressure control. - CBC w/Diff/Platelet - TSH - Cortisol - Hemoglobin A1c  2. Other fatigue Check  panel blood work. Discussed with patient it could be stress/mild depression but she seems to be okay. - CBC w/Diff/Platelet - TSH - Cortisol - Hemoglobin A1c  3. Encounter for screening colonoscopy Time for screening colonoscopy - Ambulatory referral to Gastroenterology  4. Allergic rhinitis due to pollen, unspecified seasonality   5. Obesity without serious comorbidity, unspecified classification, unspecified obesity  type Phentermine again and see how she feels with this.  I will follow-up with her in a few months.  6. Migraine without aura and without status migrainosus, not intractable She is having typical headaches for her.   No follow-ups on file.      I, Wilhemena Durie, MD, have reviewed all documentation for this visit. The documentation on 08/26/21 for the exam, diagnosis, procedures, and orders are all accurate and complete.    Elizar Alpern Cranford Mon, MD  [facility] [phone] (phone) [phone] (fax)  [place]

## 2021-08-24 ENCOUNTER — Ambulatory Visit (INDEPENDENT_AMBULATORY_CARE_PROVIDER_SITE_OTHER): Payer: BC Managed Care – PPO | Admitting: Family Medicine

## 2021-08-24 VITALS — BP 107/74 | HR 89 | Temp 98.1°F | Wt 184.0 lb

## 2021-08-24 DIAGNOSIS — E669 Obesity, unspecified: Secondary | ICD-10-CM

## 2021-08-24 DIAGNOSIS — J301 Allergic rhinitis due to pollen: Secondary | ICD-10-CM

## 2021-08-24 DIAGNOSIS — I10 Essential (primary) hypertension: Secondary | ICD-10-CM | POA: Diagnosis not present

## 2021-08-24 DIAGNOSIS — Z1211 Encounter for screening for malignant neoplasm of colon: Secondary | ICD-10-CM | POA: Diagnosis not present

## 2021-08-24 DIAGNOSIS — R5383 Other fatigue: Secondary | ICD-10-CM | POA: Diagnosis not present

## 2021-08-24 DIAGNOSIS — G43009 Migraine without aura, not intractable, without status migrainosus: Secondary | ICD-10-CM

## 2021-08-25 ENCOUNTER — Other Ambulatory Visit: Payer: Self-pay

## 2021-08-25 MED ORDER — PHENTERMINE HCL 37.5 MG PO TABS
37.5000 mg | ORAL_TABLET | Freq: Every day | ORAL | 5 refills | Status: DC
  Filled 2021-08-25: qty 30, 30d supply, fill #0
  Filled 2021-10-02: qty 30, 30d supply, fill #1
  Filled 2021-11-16: qty 30, 30d supply, fill #0
  Filled 2022-01-08 – 2022-01-15 (×2): qty 30, 30d supply, fill #1

## 2021-08-27 ENCOUNTER — Ambulatory Visit: Payer: BC Managed Care – PPO | Admitting: Family Medicine

## 2021-09-01 ENCOUNTER — Other Ambulatory Visit: Payer: Self-pay

## 2021-09-02 LAB — CORTISOL: Cortisol: 7.4 ug/dL (ref 6.2–19.4)

## 2021-09-02 LAB — HEMOGLOBIN A1C
Est. average glucose Bld gHb Est-mCnc: 126 mg/dL
Hgb A1c MFr Bld: 6 % — ABNORMAL HIGH (ref 4.8–5.6)

## 2021-09-08 ENCOUNTER — Other Ambulatory Visit: Payer: Self-pay

## 2021-09-08 ENCOUNTER — Other Ambulatory Visit: Payer: Self-pay | Admitting: *Deleted

## 2021-09-08 DIAGNOSIS — R7303 Prediabetes: Secondary | ICD-10-CM

## 2021-09-08 MED ORDER — TRULICITY 0.75 MG/0.5ML ~~LOC~~ SOAJ
0.7500 mg | SUBCUTANEOUS | 3 refills | Status: DC
  Filled 2021-09-08: qty 2, 28d supply, fill #0

## 2021-09-11 ENCOUNTER — Other Ambulatory Visit: Payer: Self-pay

## 2021-09-11 ENCOUNTER — Telehealth: Payer: Self-pay | Admitting: Family Medicine

## 2021-09-11 DIAGNOSIS — R7303 Prediabetes: Secondary | ICD-10-CM

## 2021-09-11 MED ORDER — TRULICITY 0.75 MG/0.5ML ~~LOC~~ SOAJ
0.7500 mg | SUBCUTANEOUS | 3 refills | Status: DC
  Filled 2021-09-11 – 2021-10-05 (×3): qty 2, 28d supply, fill #0

## 2021-09-11 NOTE — Telephone Encounter (Signed)
Pt called asking about the Ozemic that she thought was supposed to be sent to the pharmacy.  She checked with them and nothing has been sent.  She uses [facility] emp phar.  CB@  [phone]

## 2021-09-11 NOTE — Telephone Encounter (Signed)
Patient aware.

## 2021-09-11 NOTE — Telephone Encounter (Signed)
Rx is not going through to the pharmacy. Phoned rx in.

## 2021-09-14 ENCOUNTER — Other Ambulatory Visit: Payer: Self-pay

## 2021-09-14 MED ORDER — TRULICITY 0.75 MG/0.5ML ~~LOC~~ SOAJ
0.7500 mg | SUBCUTANEOUS | 3 refills | Status: DC
  Filled 2021-09-14: qty 2, 28d supply, fill #0

## 2021-09-15 ENCOUNTER — Other Ambulatory Visit: Payer: Self-pay

## 2021-09-30 ENCOUNTER — Other Ambulatory Visit: Payer: Self-pay

## 2021-10-02 ENCOUNTER — Other Ambulatory Visit: Payer: Self-pay

## 2021-10-02 MED ORDER — ALBUTEROL SULFATE HFA 108 (90 BASE) MCG/ACT IN AERS
INHALATION_SPRAY | RESPIRATORY_TRACT | 0 refills | Status: AC
  Filled 2021-10-02: qty 6.7, 17d supply, fill #0

## 2021-10-02 MED ORDER — AZELASTINE-FLUTICASONE 137-50 MCG/ACT NA SUSP
1.0000 | Freq: Two times a day (BID) | NASAL | 5 refills | Status: DC
Start: 2021-10-02 — End: 2022-12-08
  Filled 2021-10-02 – 2022-08-27 (×2): qty 23, 30d supply, fill #0

## 2021-10-05 ENCOUNTER — Telehealth: Payer: Self-pay

## 2021-10-05 ENCOUNTER — Encounter: Payer: Self-pay | Admitting: Physician Assistant

## 2021-10-05 ENCOUNTER — Other Ambulatory Visit: Payer: Self-pay

## 2021-10-05 DIAGNOSIS — R7303 Prediabetes: Secondary | ICD-10-CM

## 2021-10-05 NOTE — Telephone Encounter (Signed)
Have you done the PA for her    Copied from [place] [phone]. Topic: General - Other >> Oct 05, 2021  1:20 PM Ja-Kwan M wrote: Reason for CRM: Pt stated her pharmacy told her that the Rx for Dulaglutide (TRULICITY) 1.57 WI/2.0BT SOPN is listed as NA. Pt requests call back to advise what is going on with the Rx because it has been almost 1 month. Cb# [phone]

## 2021-10-06 ENCOUNTER — Other Ambulatory Visit: Payer: Self-pay

## 2021-10-07 NOTE — Telephone Encounter (Signed)
Patient was contacted by email.

## 2021-10-07 NOTE — Telephone Encounter (Signed)
Form received for PA. Completed and faxed back.

## 2021-10-07 NOTE — Telephone Encounter (Signed)
PA was started and received an N/A response.

## 2021-10-08 ENCOUNTER — Other Ambulatory Visit: Payer: Self-pay

## 2021-10-13 ENCOUNTER — Encounter: Payer: BC Managed Care – PPO | Admitting: Family Medicine

## 2021-10-14 ENCOUNTER — Other Ambulatory Visit: Payer: Self-pay

## 2021-10-14 ENCOUNTER — Encounter: Payer: Self-pay | Admitting: Physician Assistant

## 2021-10-14 ENCOUNTER — Ambulatory Visit (INDEPENDENT_AMBULATORY_CARE_PROVIDER_SITE_OTHER): Payer: BC Managed Care – PPO | Admitting: Physician Assistant

## 2021-10-14 VITALS — BP 121/86 | HR 87 | Temp 98.2°F | Ht 65.0 in | Wt 171.2 lb

## 2021-10-14 DIAGNOSIS — Z23 Encounter for immunization: Secondary | ICD-10-CM

## 2021-10-14 DIAGNOSIS — R7303 Prediabetes: Secondary | ICD-10-CM | POA: Diagnosis not present

## 2021-10-14 DIAGNOSIS — E663 Overweight: Secondary | ICD-10-CM

## 2021-10-14 DIAGNOSIS — I10 Essential (primary) hypertension: Secondary | ICD-10-CM

## 2021-10-14 DIAGNOSIS — R7989 Other specified abnormal findings of blood chemistry: Secondary | ICD-10-CM

## 2021-10-14 DIAGNOSIS — Z1231 Encounter for screening mammogram for malignant neoplasm of breast: Secondary | ICD-10-CM

## 2021-10-14 DIAGNOSIS — E785 Hyperlipidemia, unspecified: Secondary | ICD-10-CM

## 2021-10-14 DIAGNOSIS — Z1211 Encounter for screening for malignant neoplasm of colon: Secondary | ICD-10-CM

## 2021-10-14 DIAGNOSIS — Z0000 Encounter for general adult medical examination without abnormal findings: Secondary | ICD-10-CM | POA: Diagnosis not present

## 2021-10-14 MED ORDER — WEGOVY 0.25 MG/0.5ML ~~LOC~~ SOAJ
0.2500 mg | SUBCUTANEOUS | 0 refills | Status: DC
  Filled 2021-10-14: qty 2, fill #0
  Filled 2021-10-29: qty 2, 28d supply, fill #0

## 2021-10-14 NOTE — Progress Notes (Signed)
I,Leah Burnett,acting as a scribe for [employer], PA-C.,have documented all relevant documentation on the behalf of Leah Kirschner, PA-C,as directed by  Leah Kirschner, PA-C while in the presence of Leah Kirschner, PA-C.    Complete physical exam   Patient: Leah Burnett   DOB: 01-22-1970   51 y.o. Female  MRN: [id number] Visit Date: 10/14/2021  Today's healthcare provider: Mikey Kirschner, PA-C   Chief Complaint  Patient presents with   Annual Exam   Subjective    Leah Burnett is a 51 y.o. female who presents today for a complete physical exam.  She reports consuming a general diet. The patient has a physically strenuous job, but has no regular exercise apart from work.  She generally feels fairly well. She reports sleeping fairly well. She does not have additional problems to discuss today.  HPI  Pt was sent a message from [employer] that Trulicity is not covered by her insurance.  Past Medical History:  Diagnosis Date   Duodenitis    Endometriosis 2002   External thrombosed hemorrhoids 2009   Hypertension    Past Surgical History:  Procedure Laterality Date   ABDOMINAL HYSTERECTOMY     CHOLECYSTECTOMY  2009   ovary     right   OVARY SURGERY  2002   Social History   Socioeconomic History   Marital status: Married    Spouse name: Not on file   Number of children: Not on file   Years of education: Not on file   Highest education level: Not on file  Occupational History   Not on file  Tobacco Use   Smoking status: Never   Smokeless tobacco: Never  Vaping Use   Vaping Use: Never used  Substance and Sexual Activity   Alcohol use: Never    Alcohol/week: 0.0 standard drinks of alcohol    Comment: very rarely   Drug use: No   Sexual activity: Not on file  Other Topics Concern   Not on file  Social History Narrative   Not on file   Social Determinants of Health   Financial Resource Strain: Not on file  Food Insecurity: Not on file   Transportation Needs: Not on file  Physical Activity: Not on file  Stress: Not on file  Social Connections: Not on file  Intimate Partner Violence: Not on file   Family Status  Relation Name Status   Mother  Alive   Father  Alive   Brother  Alive   Brother  Alive   PGM  (Not Specified)   PGF  (Not Specified)   Neg Hx  (Not Specified)   Family History  Problem Relation Age of Onset   Hyperlipidemia Mother    Anemia Mother    Cirrhosis Mother    Hypertension Father    Heart disease Father        MI with bypass   Hyperlipidemia Father    Stroke Father    Prostate cancer Brother    Stroke Paternal Grandmother    Stroke Paternal Grandfather    Breast cancer Neg Hx    No Known Allergies  Patient Care Team: Leah Banana., MD as PCP - General (Family Medicine)   Medications: Outpatient Medications Prior to Visit  Medication Sig   albuterol (VENTOLIN HFA) 108 (90 Base) MCG/ACT inhaler 2 puffs Inhalation q 4-6 hours prn cough/wheeze 90 days   Azelastine-Fluticasone 137-50 MCG/ACT SUSP 1 spray in each nostril Nasally Twice a day 30  day(s)   hydrochlorothiazide (HYDRODIURIL) 25 MG tablet Take 1 tablet (25 mg total) by mouth daily.   pantoprazole (PROTONIX) 40 MG tablet Take 1 tablet (40 mg total) by mouth daily.   phentermine (ADIPEX-P) 37.5 MG tablet Take 1 tablet (37.5 mg total) by mouth daily before breakfast.   valsartan (DIOVAN) 80 MG tablet Take 1 tablet (80 mg total) by mouth daily.   [DISCONTINUED] Dulaglutide (TRULICITY) 0.93 AT/5.5DD SOPN Inject 0.75 mg into the skin once a week. (Patient not taking: Reported on 10/14/2021)   No facility-administered medications prior to visit.    Review of Systems  Constitutional:  Negative for chills, fatigue and fever.  HENT:  Negative for congestion, ear pain, rhinorrhea, sneezing and sore throat.   Eyes: Negative.  Negative for pain and redness.  Respiratory:  Negative for cough, shortness of breath and wheezing.    Cardiovascular:  Negative for chest pain and leg swelling.  Gastrointestinal:  Negative for abdominal pain, blood in stool, constipation, diarrhea and nausea.  Endocrine: Negative for polydipsia and polyphagia.  Genitourinary: Negative.  Negative for dysuria, flank pain, hematuria, pelvic pain, vaginal bleeding and vaginal discharge.  Musculoskeletal:  Negative for arthralgias, back pain, gait problem and joint swelling.  Skin:  Negative for rash.  Neurological: Negative.  Negative for dizziness, tremors, seizures, weakness, light-headedness, numbness and headaches.  Hematological:  Negative for adenopathy.  Psychiatric/Behavioral: Negative.  Negative for behavioral problems, confusion and dysphoric mood. The patient is not nervous/anxious and is not hyperactive.     Objective    BP 121/86 (BP Location: Left Arm, Patient Position: Sitting, Cuff Size: Large)   Pulse 87   Temp 98.2 F (36.8 C) (Oral)   Ht '5\' 5"'  (1.651 m)   Wt 171 lb 3.2 oz (77.7 kg)   SpO2 99% Comment: room air  BMI 28.49 kg/m    Today's Vitals   10/14/21 1409 10/14/21 1414  BP: (!) 130/92 121/86  Pulse: 95 87  Temp: 98.2 F (36.8 C)   TempSrc: Oral   SpO2: 99%   Weight: 171 lb 3.2 oz (77.7 kg)   Height: '5\' 5"'  (1.651 m)    Body mass index is 28.49 kg/m.   Physical Exam Constitutional:      General: She is awake.     Appearance: She is well-developed. She is not ill-appearing.  HENT:     Head: Normocephalic.     Right Ear: Tympanic membrane normal.     Left Ear: Tympanic membrane normal.     Nose: Nose normal. No congestion or rhinorrhea.     Mouth/Throat:     Pharynx: No oropharyngeal exudate or posterior oropharyngeal erythema.  Eyes:     Conjunctiva/sclera: Conjunctivae normal.     Pupils: Pupils are equal, round, and reactive to light.  Neck:     Thyroid: No thyroid mass or thyromegaly.  Cardiovascular:     Rate and Rhythm: Normal rate and regular rhythm.     Heart sounds: Normal heart  sounds.  Pulmonary:     Effort: Pulmonary effort is normal.     Breath sounds: Normal breath sounds.  Abdominal:     Palpations: Abdomen is soft.     Tenderness: There is no abdominal tenderness.  Musculoskeletal:     Right lower leg: No swelling. No edema.     Left lower leg: No swelling. No edema.  Lymphadenopathy:     Cervical: No cervical adenopathy.  Skin:    General: Skin is warm.  Neurological:  Mental Status: She is alert and oriented to person, place, and time.  Psychiatric:        Attention and Perception: Attention normal.        Mood and Affect: Mood normal.        Speech: Speech normal.        Behavior: Behavior normal. Behavior is cooperative.     Last depression screening scores    08/07/2021   10:15 AM 10/08/2020    9:27 AM 06/28/2019    2:25 PM  PHQ 2/9 Scores  PHQ - 2 Score 0 0 0  PHQ- 9 Score  0 2   Last fall risk screening    08/07/2021   10:15 AM  [place] in the past year? 0  Number falls in past yr: 0  Injury with Fall? 0   Last Audit-C alcohol use screening    08/07/2021   10:16 AM  Alcohol Use Disorder Test (AUDIT)  1. How often do you have a drink containing alcohol? 1  2. How many drinks containing alcohol do you have on a typical day when you are drinking? 0  3. How often do you have six or more drinks on one occasion? 0  AUDIT-C Score 1   A score of 3 or more in women, and 4 or more in men indicates increased risk for alcohol abuse, EXCEPT if all of the points are from question 1   No results found for any visits on 10/14/21.  Assessment & Plan    Routine Health Maintenance and Physical Exam  Exercise Activities and Dietary recommendations --balanced diet high in fiber and protein, low in sugars, carbs, fats. --physical activity/exercise 30 minutes 3-5 times a week     Immunization History  Administered Date(s) Administered   H1N1 09/30/2009   Hepatitis B, adult 09/20/1994, 10/18/1994, 04/18/1995   Influenza, High  Dose Seasonal PF 01/18/2020   Influenza,inj,Quad PF,6+ Mos 10/14/2021   Influenza,inj,quad, With Preservative 09/23/2019   Influenza-Unspecified 10/05/2014, 09/19/2018, 10/07/2020   MMR 08/22/1971   PFIZER(Purple Top)SARS-COV-2 Vaccination 01/01/2019, 01/22/2019, 06/11/2019, 01/18/2020   Tdap 06/28/2019    Health Maintenance  Topic Date Due   HIV Screening  Never done   Hepatitis C Screening  Never done   COLONOSCOPY (Pts 45-34yr Insurance coverage will need to be confirmed)  Never done   COVID-19 Vaccine (5 - Pfizer series) 03/14/2020   Zoster Vaccines- Shingrix (1 of 2) 01/14/2022 (Originally 07/15/2018)   MAMMOGRAM  10/22/2021   TETANUS/TDAP  06/27/2029   INFLUENZA VACCINE  Completed   HPV VACCINES  Aged Out    Discussed health benefits of physical activity, and encouraged her to engage in regular exercise appropriate for her age and condition.  Problem List Items Addressed This Visit       Cardiovascular and Mediastinum   Hypertension - Primary    Chronic, well controlled, managed with valsartan 80 mg and hctz 25 mg. Continue current medications Reviewed last cmp f/u 6 mo      Relevant Medications   Semaglutide-Weight Management (WEGOVY) 0.25 MG/0.5ML SOAJ     Other   Overweight (BMI 25.0-29.9)    Discussed alternative to trulicity her insurance may cover-- wegovy. Pt fits the criteria: BMI 28.49, today's weight 171 lbs. She has HLD, HTN, pre-diabetes.  Pt has tried different diets and exercise plans. She currently takes phentermine and has been able to lose a small amount of weight but often fluctuates back up when off of the phentermine.  Discussed trying wegovy-- explained use, MOA, SE. Pt would start at 0.25 mg weekly x 4 weeks, increase to 0.5 mg x 4 weeks and then again to 1 mg, at this point she would return to office. Advised pt can return earlier or call office if any issues F/u 3 mo      Relevant Medications   Semaglutide-Weight Management (WEGOVY) 0.25  MG/0.5ML SOAJ   Prediabetes    Last A1c 6.0% no comparison. Pt fits the picture of metabolic syndrome, hld, predm, htn, overweight. trulicity was sent in to treat-- but insurance did not cover.  Discussed trying wegovy-- see overweight note.      Relevant Medications   Semaglutide-Weight Management (WEGOVY) 0.25 MG/0.5ML SOAJ   Hyperlipidemia    LDL > 170 and has been for over a year. The 10-year ASCVD risk score (Arnett DK, et al., 2019) is: 3.3%  Pt would like to try wegovy before considering starting a statin. Advised that eventually a statin will likely be needed, she may have a component of familial HLD      Relevant Medications   Semaglutide-Weight Management (WEGOVY) 0.25 MG/0.5ML SOAJ   Elevated LFTs    Likely 2/2 fatty liver given other features of metabolic syndrome Pt is s/p cholecystectomy      Other Visit Diagnoses     Colon cancer screening       Relevant Orders   Ambulatory referral to Gastroenterology   Screening mammogram for breast cancer       Relevant Orders   MM 3D SCREEN BREAST BILATERAL   Need for immunization against influenza       Relevant Orders   Flu Vaccine QUAD 81moIM (Fluarix, Fluzone & Alfiuria Quad PF) (Completed)        Return in about 3 months (around 01/14/2022) for weight Management.     I, LMikey Kirschner PA-C have reviewed all documentation for this visit. The documentation on  10/14/2021  for the exam, diagnosis, procedures, and orders are all accurate and complete.  LMikey Kirschner PA-C [id number] [place] [id number]: [phone]: [phone]

## 2021-10-15 ENCOUNTER — Encounter: Payer: Self-pay | Admitting: Physician Assistant

## 2021-10-15 DIAGNOSIS — R7303 Prediabetes: Secondary | ICD-10-CM | POA: Insufficient documentation

## 2021-10-15 DIAGNOSIS — E663 Overweight: Secondary | ICD-10-CM | POA: Insufficient documentation

## 2021-10-15 DIAGNOSIS — R7989 Other specified abnormal findings of blood chemistry: Secondary | ICD-10-CM | POA: Insufficient documentation

## 2021-10-15 DIAGNOSIS — E785 Hyperlipidemia, unspecified: Secondary | ICD-10-CM | POA: Insufficient documentation

## 2021-10-15 NOTE — Assessment & Plan Note (Signed)
Last A1c 6.0% no comparison. Pt fits the picture of metabolic syndrome, hld, predm, htn, overweight. trulicity was sent in to treat-- but insurance did not cover.  Discussed trying wegovy-- see overweight note.

## 2021-10-15 NOTE — Assessment & Plan Note (Signed)
Discussed alternative to trulicity her insurance may cover-- wegovy. Pt fits the criteria: BMI 28.49, today's weight 171 lbs. She has HLD, HTN, pre-diabetes.  Pt has tried different diets and exercise plans. She currently takes phentermine and has been able to lose a small amount of weight but often fluctuates back up when off of the phentermine.  Discussed trying wegovy-- explained use, MOA, SE. Pt would start at 0.25 mg weekly x 4 weeks, increase to 0.5 mg x 4 weeks and then again to 1 mg, at this point she would return to office. Advised pt can return earlier or call office if any issues F/u 3 mo

## 2021-10-15 NOTE — Assessment & Plan Note (Signed)
Chronic, well controlled, managed with valsartan 80 mg and hctz 25 mg. Continue current medications Reviewed last cmp f/u 6 mo

## 2021-10-15 NOTE — Assessment & Plan Note (Signed)
Likely 2/2 fatty liver given other features of metabolic syndrome Pt is s/p cholecystectomy

## 2021-10-15 NOTE — Assessment & Plan Note (Signed)
LDL > 170 and has been for over a year. The 10-year ASCVD risk score (Arnett DK, et al., 2019) is: 3.3%  Pt would like to try wegovy before considering starting a statin. Advised that eventually a statin will likely be needed, she may have a component of familial HLD

## 2021-10-16 ENCOUNTER — Telehealth: Payer: Self-pay

## 2021-10-16 ENCOUNTER — Other Ambulatory Visit (HOSPITAL_COMMUNITY): Payer: Self-pay

## 2021-10-16 ENCOUNTER — Other Ambulatory Visit: Payer: Self-pay

## 2021-10-16 DIAGNOSIS — Z1211 Encounter for screening for malignant neoplasm of colon: Secondary | ICD-10-CM

## 2021-10-16 MED ORDER — NA SULFATE-K SULFATE-MG SULF 17.5-3.13-1.6 GM/177ML PO SOLN
1.0000 | Freq: Once | ORAL | 0 refills | Status: AC
  Filled 2021-10-16: qty 354, 1d supply, fill #0

## 2021-10-16 NOTE — Telephone Encounter (Signed)
Gastroenterology Pre-Procedure Review  Request Date: 11/12/21 Requesting Physician: Dr. Allen Norris  PATIENT REVIEW QUESTIONS: The patient responded to the following health history questions as indicated:    1. Are you having any GI issues? no 2. Do you have a personal history of Polyps? no 3. Do you have a family history of Colon Cancer or Polyps? yes (dad colon polyps) 4. Diabetes Mellitus? no 5. Joint replacements in the past 12 months?no 6. Major health problems in the past 3 months?no 7. Any artificial heart valves, MVP, or defibrillator?no    MEDICATIONS & ALLERGIES:    Patient reports the following regarding taking any anticoagulation/antiplatelet therapy:   Plavix, Coumadin, Eliquis, Xarelto, Lovenox, Pradaxa, Brilinta, or Effient? no Aspirin? no  Patient confirms/reports the following medications:  Current Outpatient Medications  Medication Sig Dispense Refill   albuterol (VENTOLIN HFA) 108 (90 Base) MCG/ACT inhaler 2 puffs Inhalation q 4-6 hours prn cough/wheeze 90 days 6.7 g 0   Azelastine-Fluticasone 137-50 MCG/ACT SUSP 1 spray in each nostril Nasally Twice a day 30 day(s) 23 g 5   hydrochlorothiazide (HYDRODIURIL) 25 MG tablet Take 1 tablet (25 mg total) by mouth daily. 90 tablet 3   pantoprazole (PROTONIX) 40 MG tablet Take 1 tablet (40 mg total) by mouth daily. 30 tablet 3   phentermine (ADIPEX-P) 37.5 MG tablet Take 1 tablet (37.5 mg total) by mouth daily before breakfast. 30 tablet 5   Semaglutide-Weight Management (WEGOVY) 0.25 MG/0.5ML SOAJ Inject 0.25 mg into the skin once a week. 2 mL 0   valsartan (DIOVAN) 80 MG tablet Take 1 tablet (80 mg total) by mouth daily. 90 tablet 3   No current facility-administered medications for this visit.    Patient confirms/reports the following allergies:  No Known Allergies  No orders of the defined types were placed in this encounter.   AUTHORIZATION INFORMATION Primary Insurance: 1D#: Group #:  Secondary  Insurance: 1D#: Group #:  SCHEDULE INFORMATION: Date: 11/12/21 Time: Location: [place]

## 2021-10-22 ENCOUNTER — Other Ambulatory Visit (HOSPITAL_COMMUNITY): Payer: Self-pay

## 2021-10-28 ENCOUNTER — Other Ambulatory Visit: Payer: Self-pay

## 2021-10-29 ENCOUNTER — Other Ambulatory Visit: Payer: Self-pay

## 2021-10-29 ENCOUNTER — Encounter: Payer: Self-pay | Admitting: Physician Assistant

## 2021-11-05 ENCOUNTER — Encounter: Payer: Self-pay | Admitting: Gastroenterology

## 2021-11-06 ENCOUNTER — Other Ambulatory Visit
Admission: RE | Admit: 2021-11-06 | Discharge: 2021-11-06 | Disposition: A | Discharge status: Home / Self Care | Payer: BC Managed Care – PPO | Attending: Urgent Care | Admitting: Urgent Care

## 2021-11-06 ENCOUNTER — Telehealth: Payer: Self-pay | Admitting: Urgent Care

## 2021-11-06 ENCOUNTER — Other Ambulatory Visit: Payer: Self-pay

## 2021-11-06 DIAGNOSIS — Z01818 Encounter for other preprocedural examination: Secondary | ICD-10-CM | POA: Diagnosis present

## 2021-11-06 DIAGNOSIS — E876 Hypokalemia: Secondary | ICD-10-CM

## 2021-11-06 DIAGNOSIS — Z01812 Encounter for preprocedural laboratory examination: Secondary | ICD-10-CM

## 2021-11-06 LAB — POTASSIUM: Potassium: 2.8 mmol/L — ABNORMAL LOW (ref 3.5–5.1)

## 2021-11-06 MED ORDER — POTASSIUM CHLORIDE CRYS ER 20 MEQ PO TBCR: EXTENDED_RELEASE_TABLET | ORAL | 0 refills | Status: DC

## 2021-11-06 MED ORDER — POTASSIUM CHLORIDE CRYS ER 20 MEQ PO TBCR
EXTENDED_RELEASE_TABLET | ORAL | 0 refills | Status: DC
  Filled 2021-11-06: qty 8, 6d supply, fill #0

## 2021-11-06 NOTE — Addendum Note (Signed)
Addended by: Honor Loh on: 11/06/2021 07:18 PM   Modules accepted: Orders

## 2021-11-06 NOTE — Progress Notes (Addendum)
[facility] Perioperative Services: Pre-Admission/Anesthesia Testing  Abnormal Lab Notification and Treatment Plan of Care   Date: 11/06/21  Name: Leah Burnett MRN:   [id number]  Re: Abnormal labs noted during PAT appointment   Notified:  Provider Name Provider Role Notification Mode  Lucilla Lame, MD Gastroenterology (Surgeon) Routed and/or faxed via Elease Etienne, Retia Passe., MD Primary Care Provider Routed and/or faxed via [place] and Notes:  ABNORMAL LAB VALUE(S): Lab Results  Component Value Date   K 2.8 (L) 11/06/2021   Leah Burnett is scheduled for an elective COLONOSCOPY WITH PROPOFOL on 11/12/2021. In review of her medication reconciliation, it is noted that the patient is taking prescribed diuretic medications (HCTZ 25 mg) daily. Please note, in efforts to promote a safe and effective anesthetic course, per current guidelines/standards set by the [facility] anesthesia team, the minimal acceptable K+ level for the patient to proceed with anesthesia is 3.0 mmol/L. With that being said, at her current level, elective procedure will need to be postponed until K+ is better optimized. In efforts to prevent case cancellation, will make efforts to optimize pre-procedural K+ level so that patient can safely undergo the planned procedure.  Impression and Plan:  Leah Burnett found to be HYPOkalemic at 2.8 mmol/L on preprocedural labs. She is on thiazide diuretic therapy. No daily K+ supplements ordered. Patient will be doing colon prep for procedure. K+ derangement stands to exacerbate even further. Prep solution being utilized has a small amount of K+ in it, but not enough to correct the noted hypokalemia.  Reached out to patient to discuss results, however unable to reach via phone. Detailed MyChart message sent with plans for correction of noted electrolyte derangement as follows:  Meds ordered this encounter  Medications    potassium chloride SA (KLOR-CON M) 20 MEQ tablet    Sig: Take 3 tablets (60 mEq) now, then 1 tablet (20 mEq) daily until day before procedure. Follow up with PCP for repeat labs.    Dispense:  8 tablet    Refill:  0   Encouraged patient to follow up with PCP about 2-3 weeks post-procedurally to have labs rechecked to ensure that levels are remaining within normal range. Discussed nutritional intake of K+ rich foods as an adjunctive way to keep her K+ levels normal; list of K+ rich foods verbally provided. Also mentioned ORS, however advised her not to rely solely on these drinks, as they are high in Na+, and she has a HTN diagnosis.   Will send copy of this note to surgeon and PCP to make them aware of K+ level and plans for correction. Discussed that PCP may elect to pursue a change in diuretic therapy to a K+ sparing type medication, or alternatively, they may consider adding a daily K+ supplement if K+ levels still low. Order entered to recheck K+ on the day of her surgery to ensure optimization.  Patient verbalized understanding of POC as it stands at this point. Wished patient the best of luck with her upcoming procedure and subsequent recovery. She was encouraged to return call to the PAT clinic, or to her surgeon's office, should any questions or concerns arise between now and the time of her procedure.   Encounter Diagnoses  Name Primary?   Pre-procedural laboratory examination Yes   Diuretic-induced hypokalemia    ADDENDUM: 11/06/2021 at 1910 pm - Patient contacted to discuss lab result and need for pre-procedural optimization. Employee pharmacy closed at  this point. Patient working at [employer]. Asking that Rx be sent there and she will pick up and start as prescribed tomorrow. New Rx sent per request. Dispensed # reduced to 7.   Meds ordered this encounter  Medications   DISCONTD: potassium chloride SA (KLOR-CON M) 20 MEQ tablet    Sig: Take 3 tablets (60 mEq) now, then 1  tablet (20 mEq) daily until day before procedure. Follow up with PCP for repeat labs.    Dispense:  8 tablet    Refill:  0   potassium chloride SA (KLOR-CON M) 20 MEQ tablet    Sig: Take 3 tablets (60 mEq) now, then 1 tablet (20 mEq) daily until day before procedure. Follow up with PCP for repeat labs.    Dispense:  7 tablet    Refill:  0   Quentin Mulling, MSN, APRN, FNP-C, CEN [facility]  Peri-operative Services Nurse Practitioner Phone: [phone] 11/06/21 12:20 PM  NOTE: This note has been prepared using Dragon dictation software. Despite my best ability to proofread, there is always the potential that unintentional transcriptional errors may still occur from this process.

## 2021-11-09 ENCOUNTER — Other Ambulatory Visit: Payer: Self-pay

## 2021-11-12 ENCOUNTER — Encounter: Payer: Self-pay | Admitting: Gastroenterology

## 2021-11-12 ENCOUNTER — Ambulatory Visit: Payer: BC Managed Care – PPO | Admitting: Anesthesiology

## 2021-11-12 ENCOUNTER — Ambulatory Visit
Admission: RE | Admit: 2021-11-12 | Discharge: 2021-11-12 | Disposition: A | Discharge status: Home / Self Care | Payer: BC Managed Care – PPO | Attending: Gastroenterology | Admitting: Gastroenterology

## 2021-11-12 ENCOUNTER — Encounter
Admission: RE | Disposition: A | Discharge status: Home / Self Care | Payer: Self-pay | Source: Home / Self Care | Attending: Gastroenterology

## 2021-11-12 ENCOUNTER — Other Ambulatory Visit: Payer: Self-pay

## 2021-11-12 ENCOUNTER — Other Ambulatory Visit
Admission: RE | Admit: 2021-11-12 | Discharge: 2021-11-12 | Disposition: A | Discharge status: Home / Self Care | Payer: BC Managed Care – PPO | Attending: Urgent Care | Admitting: Urgent Care

## 2021-11-12 DIAGNOSIS — K219 Gastro-esophageal reflux disease without esophagitis: Secondary | ICD-10-CM | POA: Insufficient documentation

## 2021-11-12 DIAGNOSIS — N809 Endometriosis, unspecified: Secondary | ICD-10-CM | POA: Diagnosis not present

## 2021-11-12 DIAGNOSIS — Z01818 Encounter for other preprocedural examination: Secondary | ICD-10-CM

## 2021-11-12 DIAGNOSIS — I10 Essential (primary) hypertension: Secondary | ICD-10-CM | POA: Insufficient documentation

## 2021-11-12 DIAGNOSIS — K641 Second degree hemorrhoids: Secondary | ICD-10-CM | POA: Diagnosis not present

## 2021-11-12 DIAGNOSIS — Z1211 Encounter for screening for malignant neoplasm of colon: Secondary | ICD-10-CM | POA: Diagnosis present

## 2021-11-12 DIAGNOSIS — R7303 Prediabetes: Secondary | ICD-10-CM | POA: Diagnosis not present

## 2021-11-12 DIAGNOSIS — K5730 Diverticulosis of large intestine without perforation or abscess without bleeding: Secondary | ICD-10-CM | POA: Diagnosis not present

## 2021-11-12 DIAGNOSIS — E876 Hypokalemia: Secondary | ICD-10-CM

## 2021-11-12 DIAGNOSIS — Z01812 Encounter for preprocedural laboratory examination: Secondary | ICD-10-CM

## 2021-11-12 HISTORY — DX: Gastro-esophageal reflux disease without esophagitis: K21.9

## 2021-11-12 HISTORY — PX: COLONOSCOPY WITH PROPOFOL: SHX5780

## 2021-11-12 LAB — POTASSIUM: Potassium: 3.7 mmol/L (ref 3.5–5.1)

## 2021-11-12 SURGERY — COLONOSCOPY WITH PROPOFOL
Anesthesia: General | Site: Rectum

## 2021-11-12 MED ORDER — SODIUM CHLORIDE 0.9 % IV SOLN: INTRAVENOUS | Status: DC

## 2021-11-12 MED ORDER — LACTATED RINGERS IV SOLN: INTRAVENOUS | Status: DC

## 2021-11-12 MED ORDER — ONDANSETRON HCL 4 MG/2ML IJ SOLN: 4.0000 mg | Freq: Once | INTRAMUSCULAR | Status: DC | PRN

## 2021-11-12 MED ORDER — ACETAMINOPHEN 325 MG PO TABS: 650.0000 mg | ORAL_TABLET | Freq: Once | ORAL | Status: DC | PRN

## 2021-11-12 MED ORDER — PROPOFOL 10 MG/ML IV BOLUS
INTRAVENOUS | Status: DC | PRN
  Administered 2021-11-12 (×2): 50 mg via INTRAVENOUS
  Administered 2021-11-12: 20 mg via INTRAVENOUS
  Administered 2021-11-12: 50 mg via INTRAVENOUS
  Administered 2021-11-12: 30 mg via INTRAVENOUS
  Administered 2021-11-12: 40 mg via INTRAVENOUS
  Administered 2021-11-12: 50 mg via INTRAVENOUS

## 2021-11-12 MED ORDER — STERILE WATER FOR IRRIGATION IR SOLN
Status: DC | PRN
  Administered 2021-11-12: 100 mL

## 2021-11-12 MED ORDER — ACETAMINOPHEN 160 MG/5ML PO SOLN: 325.0000 mg | ORAL | Status: DC | PRN

## 2021-11-12 MED ORDER — LIDOCAINE HCL (CARDIAC) PF 100 MG/5ML IV SOSY
PREFILLED_SYRINGE | INTRAVENOUS | Status: DC | PRN
  Administered 2021-11-12: 40 mg via INTRAVENOUS

## 2021-11-12 SURGICAL SUPPLY — 6 items
GOWN CVR UNV OPN BCK APRN NK (MISCELLANEOUS) ×2 IMPLANT
GOWN ISOL THUMB LOOP REG UNIV (MISCELLANEOUS) ×2
KIT PRC NS LF DISP ENDO (KITS) ×1 IMPLANT
KIT PROCEDURE OLYMPUS (KITS) ×1
MANIFOLD NEPTUNE II (INSTRUMENTS) ×1 IMPLANT
WATER STERILE IRR 250ML POUR (IV SOLUTION) ×1 IMPLANT

## 2021-11-12 NOTE — Anesthesia Preprocedure Evaluation (Addendum)
Anesthesia Evaluation  Patient identified by MRN, date of birth, ID band Patient awake    Reviewed: Allergy & Precautions, NPO status , Patient's Chart, lab work & pertinent test results  History of Anesthesia Complications Negative for: history of anesthetic complications  Airway Mallampati: II   Neck ROM: Full    Dental no notable dental hx.    Pulmonary neg pulmonary ROS   Pulmonary exam normal breath sounds clear to auscultation       Cardiovascular hypertension, Normal cardiovascular exam Rhythm:Regular Rate:Normal     Neuro/Psych negative neurological ROS     GI/Hepatic ,GERD  ,,  Endo/Other  Prediabetes   Renal/GU negative Renal ROS     Musculoskeletal   Abdominal   Peds  Hematology negative hematology ROS (+)   Anesthesia Other Findings   Reproductive/Obstetrics Endometriosis                              Anesthesia Physical Anesthesia Plan  ASA: 2  Anesthesia Plan: General   Post-op Pain Management:    Induction: Intravenous  PONV Risk Score and Plan: 3 and Propofol infusion, TIVA and Treatment may vary due to age or medical condition  Airway Management Planned: Natural Airway  Additional Equipment:   Intra-op Plan:   Post-operative Plan:   Informed Consent: I have reviewed the patients History and Physical, chart, labs and discussed the procedure including the risks, benefits and alternatives for the proposed anesthesia with the patient or authorized representative who has indicated his/her understanding and acceptance.       Plan Discussed with: CRNA  Anesthesia Plan Comments: (LMA/GETA backup discussed.  Patient consented for risks of anesthesia including but not limited to:  - adverse reactions to medications - damage to eyes, teeth, lips or other oral mucosa - nerve damage due to positioning  - sore throat or hoarseness - damage to heart, brain,  nerves, lungs, other parts of body or loss of life  Informed patient about role of CRNA in peri- and intra-operative care.  Patient voiced understanding.)       Anesthesia Quick Evaluation

## 2021-11-12 NOTE — Op Note (Addendum)
[facility] Gastroenterology Patient Name: Leah Burnett Procedure Date: 11/12/2021 8:39 AM MRN: [id number] Account #: [id number] Date of Birth: 1970/05/24 Admit Type: Outpatient Age: 51 Room: [facility] OR ROOM 01 Gender: Female Note Status: Finalized Instrument Name: [id number] Procedure:             Colonoscopy Indications:           Screening for colorectal malignant neoplasm Providers:             Midge Minium MD, MD Referring MD:          Ferdinand Lango. Sullivan Lone, MD (Referring MD) Medicines:             Propofol per Anesthesia Complications:         No immediate complications. Procedure:             Pre-Anesthesia Assessment:                        - Prior to the procedure, a History and Physical was                         performed, and patient medications and allergies were                         reviewed. The patient's tolerance of previous                         anesthesia was also reviewed. The risks and benefits                         of the procedure and the sedation options and risks                         were discussed with the patient. All questions were                         answered, and informed consent was obtained. Prior                         Anticoagulants: The patient has taken no anticoagulant                         or antiplatelet agents. ASA Grade Assessment: II - A                         patient with mild systemic disease. After reviewing                         the risks and benefits, the patient was deemed in                         satisfactory condition to undergo the procedure.                        After obtaining informed consent, the colonoscope was                         passed under direct vision. Throughout the procedure,  the patient's blood pressure, pulse, and oxygen                         saturations were monitored continuously. The                         Colonoscope was introduced through the  anus and                         advanced to the the cecum, identified by appendiceal                         orifice and ileocecal valve. The colonoscopy was                         performed without difficulty. The patient tolerated                         the procedure well. The quality of the bowel                         preparation was good. Findings:      The perianal and digital rectal examinations were normal.      Non-bleeding internal hemorrhoids were found during retroflexion. The       hemorrhoids were Grade II (internal hemorrhoids that prolapse but reduce       spontaneously).      Multiple small-mouthed diverticula were found in the sigmoid colon and       descending colon. Impression:            - Non-bleeding internal hemorrhoids.                        - Diverticulosis in the sigmoid colon and in the                         descending colon.                        - No specimens collected. Recommendation:        - Discharge patient to home.                        - Resume previous diet.                        - Continue present medications.                        - Repeat colonoscopy in 10 years for screening                         purposes. Procedure Code(s):     --- Professional ---                        [phone], Colonoscopy, flexible; diagnostic, including                         collection of specimen(s) by brushing or washing, when  performed (separate procedure) Diagnosis Code(s):     --- Professional ---                        Z12.11, Encounter for screening for malignant neoplasm                         of colon CPT copyright 2022 American Medical Association. All rights reserved. The codes documented in this report are preliminary and upon coder review may  be revised to meet current compliance requirements. Midge Minium MD, MD 11/12/2021 9:51:58 AM This report has been signed electronically. Number of Addenda: 0 Note Initiated On:  11/12/2021 8:39 AM Scope Withdrawal Time: 0 hours 13 minutes 57 seconds  Total Procedure Duration: 0 hours 19 minutes 12 seconds  Estimated Blood Loss:  Estimated blood loss: none.      [facility]

## 2021-11-12 NOTE — Anesthesia Postprocedure Evaluation (Signed)
Anesthesia Post Note  Patient: Leah Burnett  Procedure(s) Performed: COLONOSCOPY WITH PROPOFOL (Rectum)  Patient location during evaluation: PACU Anesthesia Type: General Level of consciousness: awake and alert, oriented and patient cooperative Pain management: pain level controlled Vital Signs Assessment: post-procedure vital signs reviewed and stable Respiratory status: spontaneous breathing, nonlabored ventilation and respiratory function stable Cardiovascular status: blood pressure returned to baseline and stable Postop Assessment: adequate PO intake Anesthetic complications: no   No notable events documented.   Last Vitals:  Vitals:   11/12/21 1000 11/12/21 1015  BP: 116/72 124/89  Pulse: 90 72  Resp: 16 10  Temp: (!) 36.3 C (!) 36.3 C  SpO2: 100% 98%    Last Pain:  Vitals:   11/12/21 1015  TempSrc:   PainSc: 0-No pain                 Reed Breech

## 2021-11-12 NOTE — H&P (Signed)
Midge Minium, MD [facility] [address] [place], [place] [address] Phone: [phone] Fax : [phone]  Primary Care Physician:  Maple Hudson., MD Primary Gastroenterologist:  Dr. Servando Snare  Pre-Procedure History & Physical: HPI:  MYKEL MOHL is a 51 y.o. female is here for a screening colonoscopy.   Past Medical History:  Diagnosis Date   Duodenitis    Endometriosis 01/05/2000   External thrombosed hemorrhoids 01/05/2007   GERD (gastroesophageal reflux disease)    Hypertension     Past Surgical History:  Procedure Laterality Date   ABDOMINAL HYSTERECTOMY     CHOLECYSTECTOMY  2009   ovary     right   OVARY SURGERY  2002    Prior to Admission medications   Medication Sig Start Date End Date Taking? Authorizing Provider  pantoprazole (PROTONIX) 40 MG tablet Take 1 tablet (40 mg total) by mouth daily. Patient taking differently: Take 40 mg by mouth daily as needed. 05/09/19  Yes Maple Hudson., MD  potassium chloride SA (KLOR-CON M) 20 MEQ tablet Take 3 tablets (60 mEq) now, then 1 tablet (20 mEq) daily until day before procedure. Follow up with PCP for repeat labs. 11/07/21 11/11/21 Yes Verlee Monte, NP  albuterol (VENTOLIN HFA) 108 (90 Base) MCG/ACT inhaler 2 puffs Inhalation q 4-6 hours prn cough/wheeze 90 days Patient not taking: Reported on 11/12/2021 10/02/21     Azelastine-Fluticasone 137-50 MCG/ACT SUSP 1 spray in each nostril Nasally Twice a day 30 day(s) Patient not taking: Reported on 11/12/2021 10/02/21     hydrochlorothiazide (HYDRODIURIL) 25 MG tablet Take 1 tablet (25 mg total) by mouth daily. 08/07/21   Debera Lat, PA-C  phentermine (ADIPEX-P) 37.5 MG tablet Take 1 tablet (37.5 mg total) by mouth daily before breakfast. 08/25/21   Maple Hudson., MD  Semaglutide-Weight Management [facility]) 0.25 MG/0.5ML SOAJ Inject 0.25 mg into the skin once a week. Patient not taking: Reported on 10/16/2021 10/14/21   Alfredia Ferguson, PA-C   valsartan (DIOVAN) 80 MG tablet Take 1 tablet (80 mg total) by mouth daily. Patient not taking: Reported on 11/05/2021 08/07/21   Debera Lat, PA-C    Allergies as of 10/16/2021   (No Known Allergies)    Family History  Problem Relation Age of Onset   Hyperlipidemia Mother    Anemia Mother    Cirrhosis Mother    Hypertension Father    Heart disease Father        MI with bypass   Hyperlipidemia Father    Stroke Father    Prostate cancer Brother    Stroke Paternal Grandmother    Stroke Paternal Grandfather    Breast cancer Neg Hx     Social History   Socioeconomic History   Marital status: Married    Spouse name: Not on file   Number of children: Not on file   Years of education: Not on file   Highest education level: Not on file  Occupational History   Not on file  Tobacco Use   Smoking status: Never   Smokeless tobacco: Never  Vaping Use   Vaping Use: Never used  Substance and Sexual Activity   Alcohol use: Never    Alcohol/week: 0.0 standard drinks of alcohol    Comment: very rarely   Drug use: No   Sexual activity: Not on file  Other Topics Concern   Not on file  Social History Narrative   Not on file   Social Determinants of Health  Financial Resource Strain: Not on file  Food Insecurity: Not on file  Transportation Needs: Not on file  Physical Activity: Not on file  Stress: Not on file  Social Connections: Not on file  Intimate Partner Violence: Not on file    Review of Systems: See HPI, otherwise negative ROS  Physical Exam: BP (!) 127/96   Pulse (!) 102   Temp 98 F (36.7 C) (Oral)   Resp 17   Ht 5\' 4"  (1.626 m)   Wt 77.8 kg   SpO2 97%   BMI 29.46 kg/m  General:   Alert,  pleasant and cooperative in NAD Head:  Normocephalic and atraumatic. Neck:  Supple; no masses or thyromegaly. Lungs:  Clear throughout to auscultation.    Heart:  Regular rate and rhythm. Abdomen:  Soft, nontender and nondistended. Normal bowel sounds, without  guarding, and without rebound.   Neurologic:  Alert and  oriented x4;  grossly normal neurologically.  Impression/Plan: LAELYN BLUMENTHAL is now here to undergo a screening colonoscopy.  Risks, benefits, and alternatives regarding colonoscopy have been reviewed with the patient.  Questions have been answered.  All parties agreeable.

## 2021-11-12 NOTE — Transfer of Care (Signed)
Immediate Anesthesia Transfer of Care Note  Patient: Leah Burnett  Procedure(s) Performed: COLONOSCOPY WITH PROPOFOL (Rectum)  Patient Location: PACU  Anesthesia Type: General  Level of Consciousness: awake, alert  and patient cooperative  Airway and Oxygen Therapy: Patient Spontanous Breathing and Patient connected to supplemental oxygen  Post-op Assessment: Post-op Vital signs reviewed, Patient's Cardiovascular Status Stable, Respiratory Function Stable, Patent Airway and No signs of Nausea or vomiting  Post-op Vital Signs: Reviewed and stable  Complications: No notable events documented.

## 2021-11-13 ENCOUNTER — Encounter: Payer: Self-pay | Admitting: Gastroenterology

## 2021-11-16 ENCOUNTER — Other Ambulatory Visit: Payer: Self-pay | Admitting: Family Medicine

## 2021-11-16 ENCOUNTER — Other Ambulatory Visit (HOSPITAL_COMMUNITY): Payer: Self-pay

## 2021-11-17 NOTE — Telephone Encounter (Signed)
Med has been dc'd on 05/23/20 Requested Prescriptions  Refused Prescriptions Disp Refills   pantoprazole (PROTONIX) 40 MG tablet 30 tablet 2    Sig: TAKE 1 TABLET BY MOUTH DAILY.     Gastroenterology: Proton Pump Inhibitors Passed - 11/16/2021  9:48 AM      Passed - Valid encounter within last 12 months    Recent Outpatient Visits           1 month ago Annual physical exam   [facility] Alfredia Ferguson, PA-C   2 months ago Essential hypertension   [facility] [place] Maple Hudson., MD   3 months ago Obesity without serious comorbidity, unspecified classification, unspecified obesity type   [facility] [place], [place], PA-C   1 year ago Annual physical exam   [facility] Maple Hudson., MD   2 years ago Annual physical exam   [facility] Maple Hudson., MD       Future Appointments             In 2 months Ok Edwards, Lillia Abed, PA-C [facility]

## 2021-11-18 ENCOUNTER — Other Ambulatory Visit (HOSPITAL_COMMUNITY): Payer: Self-pay

## 2021-11-18 ENCOUNTER — Encounter (HOSPITAL_COMMUNITY): Payer: Self-pay

## 2021-11-23 ENCOUNTER — Other Ambulatory Visit (HOSPITAL_COMMUNITY): Payer: Self-pay

## 2021-12-14 ENCOUNTER — Ambulatory Visit (INDEPENDENT_AMBULATORY_CARE_PROVIDER_SITE_OTHER): Payer: BC Managed Care – PPO | Admitting: Family Medicine

## 2021-12-14 ENCOUNTER — Encounter: Payer: Self-pay | Admitting: Family Medicine

## 2021-12-14 ENCOUNTER — Other Ambulatory Visit: Payer: Self-pay

## 2021-12-14 VITALS — BP 127/84 | HR 90 | Temp 98.7°F | Resp 16 | Wt 170.8 lb

## 2021-12-14 DIAGNOSIS — J209 Acute bronchitis, unspecified: Secondary | ICD-10-CM

## 2021-12-14 DIAGNOSIS — R059 Cough, unspecified: Secondary | ICD-10-CM | POA: Insufficient documentation

## 2021-12-14 MED ORDER — PREDNISONE 10 MG PO TABS
ORAL_TABLET | ORAL | 0 refills | Status: AC
  Filled 2021-12-14: qty 7, 6d supply, fill #0

## 2021-12-14 NOTE — Patient Instructions (Signed)
We will treat with a short course of steroid medication to help with your symptoms.   Pleas take 2 tablets daily for 2 days, followed by 1 tablet daily for 2 days and then 1/2 tablet daily for 2 days.   You will need to be seen again if you develop shortness of breath, fever, chills, of dizziness.

## 2021-12-14 NOTE — Progress Notes (Signed)
I,Joseline E Rosas,acting as a scribe for [employer], MD.,have documented all relevant documentation on the behalf of Ronnald Ramp, MD,as directed by  Ronnald Ramp, MD while in the presence of Ronnald Ramp, MD.   Established patient visit   Patient: Leah Burnett   DOB: [birth year]   51 y.o. Female  MRN: [id number] Visit Date: 12/14/2021  Today's healthcare provider: Ronnald Ramp, MD   No chief complaint on file.  Subjective    URI  This is a new problem. The current episode started 1 to 4 weeks ago. There has been no fever. Associated symptoms include coughing and rhinorrhea. Pertinent negatives include no sore throat or wheezing. Associated symptoms comments: Post nasal drip. Treatments tried: Azelastine. The treatment provided no relief.     Symptoms began after blowing leaves in her yard  She reports hx of allergies year round  She reports having a "tickle" sensation both in her chest and in her throat  Denies ear pain Reports some sinus drainage and post nasal drainage  She denies body aches  She denies diarrhea/nausea   Medications: Outpatient Medications Prior to Visit  Medication Sig   albuterol (VENTOLIN HFA) 108 (90 Base) MCG/ACT inhaler 2 puffs Inhalation q 4-6 hours prn cough/wheeze 90 days   Azelastine-Fluticasone 137-50 MCG/ACT SUSP 1 spray in each nostril Nasally Twice a day 30 day(s)   hydrochlorothiazide (HYDRODIURIL) 25 MG tablet Take 1 tablet (25 mg total) by mouth daily.   pantoprazole (PROTONIX) 40 MG tablet Take 1 tablet (40 mg total) by mouth daily. (Patient taking differently: Take 40 mg by mouth daily as needed.)   phentermine (ADIPEX-P) 37.5 MG tablet Take 1 tablet (37.5 mg total) by mouth daily before breakfast.   valsartan (DIOVAN) 80 MG tablet Take 1 tablet (80 mg total) by mouth daily.   potassium chloride SA (KLOR-CON M) 20 MEQ tablet Take 3 tablets (60 mEq) now, then 1 tablet (20  mEq) daily until day before procedure. Follow up with PCP for repeat labs.   Semaglutide-Weight Management (WEGOVY) 0.25 MG/0.5ML SOAJ Inject 0.25 mg into the skin once a week. (Patient not taking: Reported on 12/14/2021)   No facility-administered medications prior to visit.    Review of Systems  Constitutional:  Negative for fever.  HENT:  Positive for postnasal drip, rhinorrhea and voice change. Negative for sore throat.   Respiratory:  Positive for cough and chest tightness. Negative for shortness of breath and wheezing.        Objective    BP 127/84 (BP Location: Left Arm, Patient Position: Sitting, Cuff Size: Large)   Pulse 90   Temp 98.7 F (37.1 C) (Oral)   Resp 16   Wt 170 lb 12.8 oz (77.5 kg)   SpO2 97%   BMI 29.32 kg/m    Physical Exam Vitals reviewed.  Constitutional:      General: She is not in acute distress.    Appearance: Normal appearance. She is not ill-appearing, toxic-appearing or diaphoretic.  Eyes:     Conjunctiva/sclera: Conjunctivae normal.  Cardiovascular:     Rate and Rhythm: Normal rate and regular rhythm.     Pulses: Normal pulses.     Heart sounds: Normal heart sounds. No murmur heard.    No friction rub. No gallop.  Pulmonary:     Effort: Pulmonary effort is normal. No respiratory distress.     Breath sounds: Normal breath sounds. No stridor. No wheezing, rhonchi or rales.     Comments: Coughing throughout  exam  Nonproductive cough  Patient is not in respiratory distress Abdominal:     General: Bowel sounds are normal. There is no distension.     Palpations: Abdomen is soft.     Tenderness: There is no abdominal tenderness.  Musculoskeletal:     Right lower leg: No edema.     Left lower leg: No edema.  Skin:    Findings: No erythema or rash.  Neurological:     Mental Status: She is alert and oriented to person, place, and time.       No results found for any visits on 12/14/21.  Assessment & Plan     The encounter diagnosis  was Acute bronchitis, unspecified organism.  Problem List Items Addressed This Visit       Other   Cough in adult - Primary    Steroid taper, patient instructed to take 2 tablets daily for 2 days, followed by 1 tablet daily for 2 days and then 1/2 tablet daily for 2 days.  Follow up for SOB, dizziness/lightheadedness  Offered COVID testing and CXR, patient declines today          Return if symptoms worsen or fail to improve.     I, Ronnald Ramp, MD, have reviewed all documentation for this visit.  Portions of this information were initially documented by the CMA and reviewed by me for thoroughness and accuracy.      Ronnald Ramp, MD  [facility] [phone] (phone) [phone] (fax)  [facility]

## 2021-12-14 NOTE — Assessment & Plan Note (Signed)
Steroid taper, patient instructed to take 2 tablets daily for 2 days, followed by 1 tablet daily for 2 days and then 1/2 tablet daily for 2 days.  Follow up for SOB, dizziness/lightheadedness  Offered COVID testing and CXR, patient declines today

## 2022-01-06 ENCOUNTER — Encounter: Payer: BC Managed Care – PPO | Admitting: Family Medicine

## 2022-01-08 ENCOUNTER — Other Ambulatory Visit: Payer: Self-pay

## 2022-01-08 ENCOUNTER — Other Ambulatory Visit (HOSPITAL_COMMUNITY): Payer: Self-pay

## 2022-01-12 ENCOUNTER — Other Ambulatory Visit: Payer: Self-pay

## 2022-01-14 ENCOUNTER — Other Ambulatory Visit: Payer: Self-pay

## 2022-01-15 ENCOUNTER — Other Ambulatory Visit (HOSPITAL_COMMUNITY): Payer: Self-pay

## 2022-02-09 ENCOUNTER — Ambulatory Visit: Payer: BC Managed Care – PPO | Admitting: Physician Assistant

## 2022-02-15 ENCOUNTER — Ambulatory Visit: Payer: BC Managed Care – PPO | Admitting: Physician Assistant

## 2022-02-18 ENCOUNTER — Other Ambulatory Visit: Payer: Self-pay

## 2022-02-18 ENCOUNTER — Ambulatory Visit: Payer: BC Managed Care – PPO | Admitting: Physician Assistant

## 2022-02-18 ENCOUNTER — Encounter: Payer: Self-pay | Admitting: Physician Assistant

## 2022-02-18 MED ORDER — ZEPBOUND 2.5 MG/0.5ML ~~LOC~~ SOAJ
SUBCUTANEOUS | 0 refills | Status: DC
  Filled 2022-02-18: qty 2, 28d supply, fill #0

## 2022-02-18 MED ORDER — SAXENDA 18 MG/3ML ~~LOC~~ SOPN
0.6000 mg | PEN_INJECTOR | Freq: Every day | SUBCUTANEOUS | 1 refills | Status: DC
  Filled 2022-02-18: qty 15, 30d supply, fill #0

## 2022-02-18 NOTE — Assessment & Plan Note (Signed)
Comorbidities of HTN, HLD, prediabetes. Fits the picture of metabolic syndrome Has been managed on phentermine without improvement Unable to get wegovy 2/2 supply Suggested saxenda instead  Advised 0.6 mg daily x 1 week, 1.2 mg daily x 1 week and then 1.8 mg daily Will re-evaluate in 3 months staying at the 1.8 mg dose  Weight today 170 lbs BMI 29.18

## 2022-02-18 NOTE — Progress Notes (Signed)
Established patient visit   Patient: Leah Burnett   DOB: 1970/09/20   52 y.o. Female  MRN: [id number] Visit Date: 02/18/2022  Today's healthcare provider: Mikey Kirschner, PA-C   Weight management  Subjective    HPI  Follow up for weight management  The patient was last seen for this 4 months ago. Changes made at last visit include would start at 0.25 mg weekly x 4 weeks, increase to 0.5 mg x 4 weeks and then again to 1 mg,.  Pt has been unable to get wegovy 2/2 supply.  -----------------------------------------------------------------------------------------  Medications: Outpatient Medications Prior to Visit  Medication Sig   albuterol (VENTOLIN HFA) 108 (90 Base) MCG/ACT inhaler 2 puffs Inhalation q 4-6 hours prn cough/wheeze 90 days   Azelastine-Fluticasone 137-50 MCG/ACT SUSP 1 spray in each nostril Nasally Twice a day 30 day(s)   hydrochlorothiazide (HYDRODIURIL) 25 MG tablet Take 1 tablet (25 mg total) by mouth daily.   pantoprazole (PROTONIX) 40 MG tablet Take 1 tablet (40 mg total) by mouth daily. (Patient taking differently: Take 40 mg by mouth daily as needed.)   phentermine (ADIPEX-P) 37.5 MG tablet Take 1 tablet (37.5 mg total) by mouth daily before breakfast.   valsartan (DIOVAN) 80 MG tablet Take 1 tablet (80 mg total) by mouth daily.   [DISCONTINUED] Semaglutide-Weight Management (WEGOVY) 0.25 MG/0.5ML SOAJ Inject 0.25 mg into the skin once a week. (Patient not taking: Reported on 12/14/2021)   No facility-administered medications prior to visit.    Review of Systems  Constitutional:  Negative for fatigue and fever.  Respiratory:  Negative for cough and shortness of breath.   Cardiovascular:  Negative for chest pain and leg swelling.  Gastrointestinal:  Negative for abdominal pain.  Neurological:  Negative for dizziness and headaches.      Objective    BP 122/86 (BP Location: Left Arm, Patient Position: Sitting, Cuff Size: Large)   Ht 5' 4"$   (1.626 m)   Wt 170 lb (77.1 kg)   BMI 29.18 kg/m    Physical Exam Vitals reviewed.  Constitutional:      Appearance: She is not ill-appearing.  HENT:     Head: Normocephalic.  Eyes:     Conjunctiva/sclera: Conjunctivae normal.  Cardiovascular:     Rate and Rhythm: Normal rate.  Pulmonary:     Effort: Pulmonary effort is normal. No respiratory distress.  Neurological:     General: No focal deficit present.     Mental Status: She is alert and oriented to person, place, and time.  Psychiatric:        Mood and Affect: Mood normal.        Behavior: Behavior normal.     No results found for any visits on 02/18/22.  Assessment & Plan     Problem List Items Addressed This Visit       Other   Morbid obesity ([place]) - Primary    Comorbidities of HTN, HLD, prediabetes. Fits the picture of metabolic syndrome Has been managed on phentermine without improvement Unable to get wegovy 2/2 supply Suggested saxenda instead  Advised 0.6 mg daily x 1 week, 1.2 mg daily x 1 week and then 1.8 mg daily Will re-evaluate in 3 months staying at the 1.8 mg dose  Weight today 170 lbs BMI 29.18      Relevant Medications   Liraglutide -Weight Management (SAXENDA) 18 MG/3ML SOPN    Return in about 3 months (around 05/19/2022) for after starting saxenda.  I, Mikey Kirschner, PA-C have reviewed all documentation for this visit. The documentation on  02/18/22 for the exam, diagnosis, procedures, and orders are all accurate and complete.  Mikey Kirschner, PA-C [facility] [address] [place], [place], [address] Office: [phone] Fax: [place]

## 2022-02-22 ENCOUNTER — Telehealth: Payer: Self-pay

## 2022-02-22 ENCOUNTER — Other Ambulatory Visit: Payer: Self-pay

## 2022-02-22 NOTE — Telephone Encounter (Signed)
Copied from [place] [id number]. Topic: General - Inquiry >> Feb 22, 2022  3:37 PM Rosanne Ashing P wrote: Reason for CRM: Pt called saying the Kirke Shaggy is not cover by her insurance.  Please advise.  She said the pharmacy ask her if she has tried Ozempic.  CB#  [phone]

## 2022-02-22 NOTE — Telephone Encounter (Signed)
Please have patient follow up with me to discuss other options to assist with weight loss.   I would recommend patient contact insurance to discuss if Mancel Parsons would be covered with insurance or Zepbound based on hx of overweight with BMI of 29.18 and pre-diabetes.   Eulis Foster, MD  [facility]

## 2022-02-23 ENCOUNTER — Encounter: Payer: Self-pay | Admitting: Physician Assistant

## 2022-02-24 ENCOUNTER — Other Ambulatory Visit: Payer: Self-pay

## 2022-02-24 ENCOUNTER — Other Ambulatory Visit: Payer: Self-pay | Admitting: Physician Assistant

## 2022-02-24 MED ORDER — SAXENDA 18 MG/3ML ~~LOC~~ SOPN
0.6000 mg | PEN_INJECTOR | Freq: Every day | SUBCUTANEOUS | 1 refills | Status: DC
  Filled 2022-03-18 (×2): qty 15, 30d supply, fill #0

## 2022-02-24 NOTE — Telephone Encounter (Signed)
Leah Burnett, you saw this patient for weight loss and started her on Saxenda.

## 2022-02-25 ENCOUNTER — Other Ambulatory Visit: Payer: Self-pay

## 2022-02-25 ENCOUNTER — Telehealth: Payer: Self-pay | Admitting: Family Medicine

## 2022-02-25 NOTE — Telephone Encounter (Signed)
This is now a duplicate thread pt has been [employer]

## 2022-02-25 NOTE — Telephone Encounter (Signed)
Recieved a fax from covermymeds requires additional action to complete.   [id number]

## 2022-03-02 ENCOUNTER — Other Ambulatory Visit: Payer: Self-pay | Admitting: Family Medicine

## 2022-03-02 ENCOUNTER — Other Ambulatory Visit (HOSPITAL_COMMUNITY): Payer: Self-pay

## 2022-03-02 NOTE — Telephone Encounter (Signed)
This is regarding the saxenda-- on cover my meds:  Your PA has been resolved, no additional PA is required. For further inquiries please contact the number on the back of the member prescription card.

## 2022-03-11 ENCOUNTER — Telehealth: Payer: Self-pay | Admitting: Family Medicine

## 2022-03-11 ENCOUNTER — Other Ambulatory Visit: Payer: Self-pay

## 2022-03-11 DIAGNOSIS — K219 Gastro-esophageal reflux disease without esophagitis: Secondary | ICD-10-CM

## 2022-03-11 NOTE — Telephone Encounter (Signed)
Pt is calling in because [place] sent a request over regarding medications phentermine (ADIPEX-P) 37.5 MG tablet [id number] pantoprazole (PROTONIX) 40 MG tablet [id number] and they hadn't heard anything back. Please advise.

## 2022-03-11 NOTE — Telephone Encounter (Signed)
Routing this to you Ria Comment since she saw you for this.

## 2022-03-11 NOTE — Telephone Encounter (Signed)
Pt also wants to know if the Phentermine can be sent back to [facility].

## 2022-03-16 ENCOUNTER — Other Ambulatory Visit: Payer: Self-pay | Admitting: Physician Assistant

## 2022-03-16 ENCOUNTER — Other Ambulatory Visit: Payer: Self-pay

## 2022-03-16 DIAGNOSIS — E663 Overweight: Secondary | ICD-10-CM

## 2022-03-16 MED ORDER — PANTOPRAZOLE SODIUM 40 MG PO TBEC
40.0000 mg | DELAYED_RELEASE_TABLET | Freq: Every day | ORAL | 3 refills | Status: AC
  Filled 2022-03-16: qty 30, 30d supply, fill #0

## 2022-03-16 MED ORDER — PHENTERMINE HCL 37.5 MG PO TABS
37.5000 mg | ORAL_TABLET | Freq: Every day | ORAL | 0 refills | Status: DC
  Filled 2022-03-16: qty 90, 90d supply, fill #0

## 2022-03-16 NOTE — Telephone Encounter (Signed)
Patient advised. Verbalized understanding

## 2022-03-17 ENCOUNTER — Other Ambulatory Visit (HOSPITAL_COMMUNITY): Payer: Self-pay

## 2022-03-17 MED ORDER — PANTOPRAZOLE SODIUM 40 MG PO TBEC
40.0000 mg | DELAYED_RELEASE_TABLET | Freq: Every day | ORAL | 1 refills | Status: DC
  Filled 2022-03-17: qty 90, fill #0
  Filled 2022-04-12: qty 90, 90d supply, fill #0

## 2022-03-17 NOTE — Telephone Encounter (Signed)
Requested Prescriptions  Pending Prescriptions Disp Refills   pantoprazole (PROTONIX) 40 MG tablet 90 tablet 1    Sig: TAKE 1 TABLET BY MOUTH DAILY.     Gastroenterology: Proton Pump Inhibitors Passed - 03/02/2022  8:55 AM      Passed - Valid encounter within last 12 months    Recent Outpatient Visits           3 weeks ago Morbid obesity [facility])   [place] Mikey Kirschner, PA-C   3 months ago Acute bronchitis, unspecified organism   [place], [place], [place]   5 months ago Annual physical exam   [place] [facility] Mikey Kirschner, PA-C   6 months ago Essential hypertension   [place] Eulas Post, MD   7 months ago Obesity without serious comorbidity, unspecified classification, unspecified obesity type   [facility] [place], [place], [place]

## 2022-03-18 ENCOUNTER — Other Ambulatory Visit: Payer: Self-pay

## 2022-03-18 ENCOUNTER — Telehealth: Payer: Self-pay | Admitting: Family Medicine

## 2022-03-18 NOTE — Telephone Encounter (Signed)
Please call patient to clarify on insurance coverage. Her last A1c was in pre-diabetes range and not diabetes. Recommend she have repeat A1c as mounjaro and ozempic are indicated for diabetes only.   Eulis Foster, MD  [facility]

## 2022-03-18 NOTE — Telephone Encounter (Signed)
Pt called in with meds that would be covered under her insurance, since Trulicity, wegovy and saxendo isnt covered and too expensive, Suggestions she gave are, monjauro or Ozempic

## 2022-03-19 NOTE — Telephone Encounter (Signed)
Spoke with patient at length to try and advise of the A1C protocol most PCPs use, versus the 5.7 she was told by [facility] rep. Patient states [employer] told her they will cover Ozempic/Mounjaro with A1C over 5.7. Advised her she has not been officially diagnosed with DM, therefore we cannot complete a PA that states this, which means they will most likely deny any PA we submit. Relayed Dr. Ruffin Frederick recommendation for an OV and labs, appt scheduled 03/26/22.   FYI - thanks!

## 2022-03-25 NOTE — Progress Notes (Deleted)
      Established patient visit   Patient: Leah Burnett   DOB: 07-03-1970   52 y.o. Female  MRN: [id number] Visit Date: 03/26/2022  Today's healthcare provider: Eulis Foster, MD   No chief complaint on file.  Subjective    HPI  Patient coming to discuss A1C and mounjaro or ozempic medication for weight loss/DM.   Lab Results  Component Value Date   HGBA1C 6.0 (H) 09/01/2021   Wt Readings from Last 3 Encounters:  02/18/22 170 lb (77.1 kg)  12/14/21 170 lb 12.8 oz (77.5 kg)  11/12/21 171 lb 9.6 oz (77.8 kg)       Medications: Outpatient Medications Prior to Visit  Medication Sig   albuterol (VENTOLIN HFA) 108 (90 Base) MCG/ACT inhaler 2 puffs Inhalation q 4-6 hours prn cough/wheeze 90 days   Azelastine-Fluticasone 137-50 MCG/ACT SUSP 1 spray in each nostril Nasally Twice a day 30 day(s)   hydrochlorothiazide (HYDRODIURIL) 25 MG tablet Take 1 tablet (25 mg total) by mouth daily.   Liraglutide -Weight Management (SAXENDA) 18 MG/3ML SOPN Inject 0.6 mg into the skin daily. For four weeks and then increase to 1.2 mg daily for 4 weeks and then increase to 1.8 mg daily. Speak to provider before increasing further   pantoprazole (PROTONIX) 40 MG tablet Take 1 tablet (40 mg total) by mouth daily.   pantoprazole (PROTONIX) 40 MG tablet Take 1 tablet (40 mg total) by mouth daily.   phentermine (ADIPEX-P) 37.5 MG tablet Take 1 tablet (37.5 mg total) by mouth daily before breakfast.   valsartan (DIOVAN) 80 MG tablet Take 1 tablet (80 mg total) by mouth daily.   No facility-administered medications prior to visit.    Review of Systems  {Labs  Heme  Chem  Endocrine  Serology  Results Review (optional):23779}   Objective    There were no vitals taken for this visit. {Show previous vital signs (optional):23777}  Physical Exam  ***  No results found for any visits on 03/26/22.  Assessment & Plan     ***  No follow-ups on file.      {provider  attestation***:1}   Eulis Foster, MD  [facility] [phone] (phone) [phone] (fax)  [place]

## 2022-03-26 ENCOUNTER — Ambulatory Visit: Payer: BC Managed Care – PPO | Admitting: Family Medicine

## 2022-03-26 DIAGNOSIS — R7303 Prediabetes: Secondary | ICD-10-CM

## 2022-03-30 NOTE — Progress Notes (Deleted)
      Established patient visit   Patient: Leah Burnett   DOB: 07-13-1970   52 y.o. Female  MRN: [id number] Visit Date: 03/31/2022  Today's healthcare provider: Mikey Kirschner, PA-C   No chief complaint on file.  Subjective    HPI  Prediabetes, Follow-up  Lab Results  Component Value Date   HGBA1C 6.0 (H) 09/01/2021   GLUCOSE 92 08/07/2021   GLUCOSE 87 10/08/2020   GLUCOSE 99 07/18/2019    Last seen for for this6 months ago.  Management since that visit includes rx was given to try Trulicity. Patient is now on saxenda.  Current symptoms include {Symptoms; diabetes:14075} and have been {Desc; course:15616}.  Prior visit with dietician: {yes/no:17258} Current diet: {diet habits:16563} Current exercise: {exercise types:16438}  Pertinent Labs:    Component Value Date/Time   CHOL 246 (H) 08/07/2021 1111   TRIG 131 08/07/2021 1111   CHOLHDL 5.9 (H) 08/07/2021 1111   CREATININE 0.88 08/07/2021 1111   CREATININE 0.82 12/14/2011 0541    Wt Readings from Last 3 Encounters:  02/18/22 170 lb (77.1 kg)  12/14/21 170 lb 12.8 oz (77.5 kg)  11/12/21 171 lb 9.6 oz (77.8 kg)    -----------------------------------------------------------------------------------------   Medications: Outpatient Medications Prior to Visit  Medication Sig   albuterol (VENTOLIN HFA) 108 (90 Base) MCG/ACT inhaler 2 puffs Inhalation q 4-6 hours prn cough/wheeze 90 days   Azelastine-Fluticasone 137-50 MCG/ACT SUSP 1 spray in each nostril Nasally Twice a day 30 day(s)   hydrochlorothiazide (HYDRODIURIL) 25 MG tablet Take 1 tablet (25 mg total) by mouth daily.   Liraglutide -Weight Management (SAXENDA) 18 MG/3ML SOPN Inject 0.6 mg into the skin daily. For four weeks and then increase to 1.2 mg daily for 4 weeks and then increase to 1.8 mg daily. Speak to provider before increasing further   pantoprazole (PROTONIX) 40 MG tablet Take 1 tablet (40 mg total) by mouth daily.   pantoprazole (PROTONIX) 40  MG tablet Take 1 tablet (40 mg total) by mouth daily.   phentermine (ADIPEX-P) 37.5 MG tablet Take 1 tablet (37.5 mg total) by mouth daily before breakfast.   valsartan (DIOVAN) 80 MG tablet Take 1 tablet (80 mg total) by mouth daily.   No facility-administered medications prior to visit.    Review of Systems  {Labs  Heme  Chem  Endocrine  Serology  Results Review (optional):23779}   Objective    There were no vitals taken for this visit. {Show previous vital signs (optional):23777}  Physical Exam  ***  No results found for any visits on 03/31/22.  Assessment & Plan     ***  No follow-ups on file.      {provider attestation***:1}   Mikey Kirschner, PA-C  [place] [phone] (phone) [phone] (fax)  [place]

## 2022-03-31 ENCOUNTER — Ambulatory Visit: Payer: BC Managed Care – PPO | Admitting: Physician Assistant

## 2022-04-01 ENCOUNTER — Ambulatory Visit (INDEPENDENT_AMBULATORY_CARE_PROVIDER_SITE_OTHER): Payer: BC Managed Care – PPO | Admitting: Physician Assistant

## 2022-04-01 ENCOUNTER — Encounter: Payer: Self-pay | Admitting: Physician Assistant

## 2022-04-01 VITALS — BP 125/88 | HR 90 | Ht 64.0 in | Wt 167.9 lb

## 2022-04-01 DIAGNOSIS — R7303 Prediabetes: Secondary | ICD-10-CM

## 2022-04-01 NOTE — Progress Notes (Signed)
I,J'ya E Hunter,acting as a scribe for [employer], PA-C.,have documented all relevant documentation on the behalf of Mikey Kirschner, PA-C,as directed by  Mikey Kirschner, PA-C while in the presence of Mikey Kirschner, PA-C.   Established patient visit   Patient: Leah Burnett   DOB: Feb 14, 1970   52 y.o. Female  MRN: [id number] Visit Date: 04/01/2022  Today's healthcare provider: Mikey Kirschner, PA-C   Chief Complaint  Patient presents with   Follow-up    A1C    Subjective    HPI  Pt would like her A1c rechecked as she has had a few cavities and thinks her sugar has gone up.  Medications: Outpatient Medications Prior to Visit  Medication Sig   albuterol (VENTOLIN HFA) 108 (90 Base) MCG/ACT inhaler 2 puffs Inhalation q 4-6 hours prn cough/wheeze 90 days   Azelastine-Fluticasone 137-50 MCG/ACT SUSP 1 spray in each nostril Nasally Twice a day 30 day(s)   hydrochlorothiazide (HYDRODIURIL) 25 MG tablet Take 1 tablet (25 mg total) by mouth daily.   pantoprazole (PROTONIX) 40 MG tablet Take 1 tablet (40 mg total) by mouth daily.   pantoprazole (PROTONIX) 40 MG tablet Take 1 tablet (40 mg total) by mouth daily.   phentermine (ADIPEX-P) 37.5 MG tablet Take 1 tablet (37.5 mg total) by mouth daily before breakfast.   valsartan (DIOVAN) 80 MG tablet Take 1 tablet (80 mg total) by mouth daily.   [DISCONTINUED] Liraglutide -Weight Management (SAXENDA) 18 MG/3ML SOPN Inject 0.6 mg into the skin daily. For four weeks and then increase to 1.2 mg daily for 4 weeks and then increase to 1.8 mg daily. Speak to provider before increasing further (Patient not taking: Reported on 04/01/2022)   No facility-administered medications prior to visit.    Review of Systems  Constitutional:  Negative for fatigue and fever.  Respiratory:  Negative for cough and shortness of breath.   Cardiovascular:  Negative for chest pain and leg swelling.  Gastrointestinal:  Negative for abdominal pain.   Neurological:  Negative for dizziness and headaches.       Objective    BP 125/88 (BP Location: Right Arm, Patient Position: Sitting, Cuff Size: Large)   Pulse 90   Ht 5\' 4"  (1.626 m)   Wt 167 lb 14.4 oz (76.2 kg)   BMI 28.82 kg/m   Physical Exam Vitals reviewed.  Constitutional:      Appearance: She is not ill-appearing.  HENT:     Head: Normocephalic.  Eyes:     Conjunctiva/sclera: Conjunctivae normal.  Cardiovascular:     Rate and Rhythm: Normal rate.  Pulmonary:     Effort: Pulmonary effort is normal. No respiratory distress.  Neurological:     General: No focal deficit present.     Mental Status: She is alert and oriented to person, place, and time.  Psychiatric:        Mood and Affect: Mood normal.        Behavior: Behavior normal.     Results for orders placed or performed in visit on 04/01/22  HgB A1c  Result Value Ref Range   Hgb A1c MFr Bld 5.7 (H) 4.8 - 5.6 %   Est. average glucose Bld gHb Est-mCnc 117 mg/dL  Comprehensive Metabolic Panel (CMET)  Result Value Ref Range   Glucose 93 70 - 99 mg/dL   BUN 15 6 - 24 mg/dL   Creatinine, Ser 0.92 0.57 - 1.00 mg/dL   eGFR 74 >59 mL/min/1.73   BUN/Creatinine Ratio 16  9 - 23   Sodium 144 134 - 144 mmol/L   Potassium 4.1 3.5 - 5.2 mmol/L   Chloride 103 96 - 106 mmol/L   CO2 24 20 - 29 mmol/L   Calcium 9.7 8.7 - 10.2 mg/dL   Total Protein 6.9 6.0 - 8.5 g/dL   Albumin 4.3 3.8 - 4.9 g/dL   Globulin, Total 2.6 1.5 - 4.5 g/dL   Albumin/Globulin Ratio 1.7 1.2 - 2.2   Bilirubin Total 0.2 0.0 - 1.2 mg/dL   Alkaline Phosphatase 130 (H) 44 - 121 IU/L   AST 17 0 - 40 IU/L   ALT 20 0 - 32 IU/L    Assessment & Plan     Problem List Items Addressed This Visit       Other   Prediabetes - Primary    Will repeat A1c today Advised pt options for metabolic control when she is prediabetic is essentially only metformin. If we were to start metformin would start with 500 mg xr and increase to max 2000 mg daily       Relevant Orders   HgB A1c (Completed)   Comprehensive Metabolic Panel (CMET) (Completed)     Return if symptoms worsen or fail to improve.     I, Mikey Kirschner, PA-C have reviewed all documentation for this visit. The documentation on  04/02/22 for the exam, diagnosis, procedures, and orders are all accurate and complete.  Mikey Kirschner, PA-C [facility] [address] [place], [place], [address] Office: [phone] Fax: [place]

## 2022-04-02 ENCOUNTER — Other Ambulatory Visit: Payer: Self-pay

## 2022-04-02 ENCOUNTER — Encounter: Payer: Self-pay | Admitting: Physician Assistant

## 2022-04-02 ENCOUNTER — Other Ambulatory Visit: Payer: Self-pay | Admitting: Physician Assistant

## 2022-04-02 DIAGNOSIS — R7303 Prediabetes: Secondary | ICD-10-CM

## 2022-04-02 LAB — COMPREHENSIVE METABOLIC PANEL
ALT: 20 IU/L (ref 0–32)
AST: 17 IU/L (ref 0–40)
Albumin/Globulin Ratio: 1.7 (ref 1.2–2.2)
Albumin: 4.3 g/dL (ref 3.8–4.9)
Alkaline Phosphatase: 130 IU/L — ABNORMAL HIGH (ref 44–121)
BUN/Creatinine Ratio: 16 (ref 9–23)
BUN: 15 mg/dL (ref 6–24)
Bilirubin Total: 0.2 mg/dL (ref 0.0–1.2)
CO2: 24 mmol/L (ref 20–29)
Calcium: 9.7 mg/dL (ref 8.7–10.2)
Chloride: 103 mmol/L (ref 96–106)
Creatinine, Ser: 0.92 mg/dL (ref 0.57–1.00)
Globulin, Total: 2.6 g/dL (ref 1.5–4.5)
Glucose: 93 mg/dL (ref 70–99)
Potassium: 4.1 mmol/L (ref 3.5–5.2)
Sodium: 144 mmol/L (ref 134–144)
Total Protein: 6.9 g/dL (ref 6.0–8.5)
eGFR: 74 mL/min/{1.73_m2} (ref >59)

## 2022-04-02 LAB — HEMOGLOBIN A1C
Est. average glucose Bld gHb Est-mCnc: 117 mg/dL
Hgb A1c MFr Bld: 5.7 % — ABNORMAL HIGH (ref 4.8–5.6)

## 2022-04-02 MED ORDER — METFORMIN HCL ER 500 MG PO TB24
ORAL_TABLET | ORAL | 1 refills | Status: AC
  Filled 2022-04-02: qty 90, 48d supply, fill #0
  Filled 2022-06-21: qty 90, 42d supply, fill #1

## 2022-04-02 NOTE — Assessment & Plan Note (Signed)
Will repeat A1c today Advised pt options for metabolic control when she is prediabetic is essentially only metformin. If we were to start metformin would start with 500 mg xr and increase to max 2000 mg daily

## 2022-04-12 ENCOUNTER — Other Ambulatory Visit (HOSPITAL_COMMUNITY): Payer: Self-pay

## 2022-04-12 ENCOUNTER — Other Ambulatory Visit: Payer: Self-pay

## 2022-06-21 ENCOUNTER — Other Ambulatory Visit (HOSPITAL_COMMUNITY): Payer: Self-pay

## 2022-07-30 ENCOUNTER — Other Ambulatory Visit: Payer: Self-pay

## 2022-07-30 ENCOUNTER — Other Ambulatory Visit: Payer: Self-pay | Admitting: Physician Assistant

## 2022-07-30 DIAGNOSIS — E663 Overweight: Secondary | ICD-10-CM

## 2022-07-30 NOTE — Telephone Encounter (Signed)
Requested medication (s) are due for refill today - provider review   Requested medication (s) are on the active medication list -yes  Future visit scheduled -no  Last refill: 03/16/22 #90  Notes to clinic: non delegated Rx  Requested Prescriptions  Pending Prescriptions Disp Refills   phentermine (ADIPEX-P) 37.5 MG tablet 90 tablet 0    Sig: Take 1 tablet (37.5 mg total) by mouth daily before breakfast.     Not Delegated - Neurology: Anticonvulsants - Controlled - phentermine hydrochloride Failed - 07/30/2022 11:19 AM      Failed - This refill cannot be delegated      Failed - Weight completed in the last 3 months    Wt Readings from Last 1 Encounters:  04/01/22 167 lb 14.4 oz (76.2 kg)         Passed - eGFR in normal range and within 360 days    EGFR (African American)  Date Value Ref Range Status  12/14/2011 >60  Final   GFR calc Af Amer  Date Value Ref Range Status  07/18/2019 87 >59 mL/min/1.73 Final    Comment:    **[facility] currently reports eGFR in compliance with the current**   recommendations of the [employer]. [facility] will   update reporting as new guidelines are published from the NKF-ASN   Task force.    EGFR (Non-African Amer.)  Date Value Ref Range Status  12/14/2011 >60  Final    Comment:    eGFR values <63mL/min/1.73 m2 may be an indication of chronic kidney disease (CKD). Calculated eGFR is useful in patients with stable renal function. The eGFR calculation will not be reliable in acutely ill patients when serum creatinine is changing rapidly. It is not useful in  patients on dialysis. The eGFR calculation may not be applicable to patients at the low and high extremes of body sizes, pregnant women, and vegetarians.    GFR calc non Af Amer  Date Value Ref Range Status  07/18/2019 75 >59 mL/min/1.73 Final   eGFR  Date Value Ref Range Status  04/01/2022 74 >59 mL/min/1.73 Final         Passed - Cr in normal range and within  360 days    Creatinine  Date Value Ref Range Status  12/14/2011 0.82 0.60 - 1.30 mg/dL Final   Creatinine, Ser  Date Value Ref Range Status  04/01/2022 0.92 0.57 - 1.00 mg/dL Final         Passed - Last BP in normal range    BP Readings from Last 1 Encounters:  04/01/22 125/88         Passed - Valid encounter within last 6 months    Recent Outpatient Visits           4 months ago Prediabetes   [facility] Alfredia Ferguson, PA-C   5 months ago Morbid obesity [facility])   [place] [facility] Alfredia Ferguson, PA-C   7 months ago Acute bronchitis, unspecified organism   [place] [facility] Simmons-Robinson, [place], MD   9 months ago Annual physical exam   [facility] Alfredia Ferguson, PA-C   11 months ago Essential hypertension    [facility] Bosie Clos, MD                 Requested Prescriptions  Pending Prescriptions Disp Refills   phentermine (ADIPEX-P) 37.5 MG tablet 90 tablet 0    Sig: Take 1  tablet (37.5 mg total) by mouth daily before breakfast.     Not Delegated - Neurology: Anticonvulsants - Controlled - phentermine hydrochloride Failed - 07/30/2022 11:19 AM      Failed - This refill cannot be delegated      Failed - Weight completed in the last 3 months    Wt Readings from Last 1 Encounters:  04/01/22 167 lb 14.4 oz (76.2 kg)         Passed - eGFR in normal range and within 360 days    EGFR (African American)  Date Value Ref Range Status  12/14/2011 >60  Final   GFR calc Af Amer  Date Value Ref Range Status  07/18/2019 87 >59 mL/min/1.73 Final    Comment:    **[facility] currently reports eGFR in compliance with the current**   recommendations of the [employer]. [facility] will   update reporting as new guidelines are published from the NKF-ASN   Task force.    EGFR (Non-African Amer.)  Date Value Ref Range Status   12/14/2011 >60  Final    Comment:    eGFR values <46mL/min/1.73 m2 may be an indication of chronic kidney disease (CKD). Calculated eGFR is useful in patients with stable renal function. The eGFR calculation will not be reliable in acutely ill patients when serum creatinine is changing rapidly. It is not useful in  patients on dialysis. The eGFR calculation may not be applicable to patients at the low and high extremes of body sizes, pregnant women, and vegetarians.    GFR calc non Af Amer  Date Value Ref Range Status  07/18/2019 75 >59 mL/min/1.73 Final   eGFR  Date Value Ref Range Status  04/01/2022 74 >59 mL/min/1.73 Final         Passed - Cr in normal range and within 360 days    Creatinine  Date Value Ref Range Status  12/14/2011 0.82 0.60 - 1.30 mg/dL Final   Creatinine, Ser  Date Value Ref Range Status  04/01/2022 0.92 0.57 - 1.00 mg/dL Final         Passed - Last BP in normal range    BP Readings from Last 1 Encounters:  04/01/22 125/88         Passed - Valid encounter within last 6 months    Recent Outpatient Visits           4 months ago Prediabetes   [facility] Alfredia Ferguson, PA-C   5 months ago Morbid obesity [facility])   [place] [facility] Alfredia Ferguson, PA-C   7 months ago Acute bronchitis, unspecified organism   [place] [facility] Simmons-Robinson, [place], MD   9 months ago Annual physical exam   [facility] Alfredia Ferguson, PA-C   11 months ago Essential hypertension   [place] [facility] Bosie Clos, MD

## 2022-08-02 ENCOUNTER — Telehealth: Payer: Self-pay | Admitting: Family Medicine

## 2022-08-02 ENCOUNTER — Other Ambulatory Visit: Payer: Self-pay

## 2022-08-02 MED FILL — Phentermine HCl Tab 37.5 MG: ORAL | 30 days supply | Qty: 30 | Fill #0 | Status: CN

## 2022-08-02 NOTE — Telephone Encounter (Signed)
Received a fax from covermymeds for phenermine [employer] 37.5mg   Key: [id number]

## 2022-08-02 NOTE — Telephone Encounter (Signed)
PA not done. Patient to pay out-of-pocket.  

## 2022-08-06 ENCOUNTER — Other Ambulatory Visit: Payer: Self-pay

## 2022-08-06 MED FILL — Phentermine HCl Tab 37.5 MG: ORAL | 30 days supply | Qty: 30 | Fill #0 | Status: AC

## 2022-08-27 ENCOUNTER — Other Ambulatory Visit: Payer: Self-pay | Admitting: Physician Assistant

## 2022-08-27 ENCOUNTER — Other Ambulatory Visit (HOSPITAL_COMMUNITY): Payer: Self-pay

## 2022-08-27 ENCOUNTER — Other Ambulatory Visit: Payer: Self-pay

## 2022-08-27 DIAGNOSIS — E669 Obesity, unspecified: Secondary | ICD-10-CM

## 2022-08-27 DIAGNOSIS — I10 Essential (primary) hypertension: Secondary | ICD-10-CM

## 2022-08-27 MED ORDER — HYDROCHLOROTHIAZIDE 25 MG PO TABS
25.0000 mg | ORAL_TABLET | Freq: Every day | ORAL | 0 refills | Status: DC
  Filled 2022-08-27: qty 90, 90d supply, fill #0

## 2022-08-27 NOTE — Telephone Encounter (Signed)
Requested Prescriptions  Pending Prescriptions Disp Refills   hydrochlorothiazide (HYDRODIURIL) 25 MG tablet 90 tablet 0    Sig: Take 1 tablet (25 mg total) by mouth daily.     Cardiovascular: Diuretics - Thiazide Passed - 08/27/2022 10:03 AM      Passed - Cr in normal range and within 180 days    Creatinine  Date Value Ref Range Status  12/14/2011 0.82 0.60 - 1.30 mg/dL Final   Creatinine, Ser  Date Value Ref Range Status  04/01/2022 0.92 0.57 - 1.00 mg/dL Final         Passed - K in normal range and within 180 days    Potassium  Date Value Ref Range Status  04/01/2022 4.1 3.5 - 5.2 mmol/L Final  12/09/2011 4.2 3.5 - 5.1 mmol/L Final         Passed - Na in normal range and within 180 days    Sodium  Date Value Ref Range Status  04/01/2022 144 134 - 144 mmol/L Final  12/09/2011 141 136 - 145 mmol/L Final         Passed - Last BP in normal range    BP Readings from Last 1 Encounters:  04/01/22 125/88         Passed - Valid encounter within last 6 months    Recent Outpatient Visits           4 months ago Prediabetes   [facility] Alfredia Ferguson, PA-C   6 months ago Morbid obesity [facility])   [place] [facility] Ferguson, PA-C   8 months ago Acute bronchitis, unspecified organism   [place] [facility] Simmons-Robinson, [place], MD   10 months ago Annual physical exam   [facility] Alfredia Ferguson, PA-C   1 year ago Essential hypertension    [facility] Bosie Clos, MD

## 2022-08-30 ENCOUNTER — Other Ambulatory Visit (HOSPITAL_COMMUNITY): Payer: Self-pay

## 2022-10-01 ENCOUNTER — Other Ambulatory Visit: Payer: Self-pay

## 2022-10-01 MED ORDER — AZELASTINE-FLUTICASONE 137-50 MCG/ACT NA SUSP
1.0000 | Freq: Two times a day (BID) | NASAL | 5 refills | Status: AC
  Filled 2022-10-01 – 2022-11-24 (×3): qty 23, 30d supply, fill #0
  Filled 2023-06-27: qty 23, 30d supply, fill #1

## 2022-10-01 MED ORDER — ALBUTEROL SULFATE HFA 108 (90 BASE) MCG/ACT IN AERS
2.0000 | INHALATION_SPRAY | RESPIRATORY_TRACT | 0 refills | Status: DC | PRN
  Filled 2022-10-01: qty 6.7, 30d supply, fill #0

## 2022-10-11 ENCOUNTER — Other Ambulatory Visit: Payer: Self-pay

## 2022-11-24 ENCOUNTER — Other Ambulatory Visit: Payer: Self-pay

## 2022-11-24 ENCOUNTER — Other Ambulatory Visit (HOSPITAL_COMMUNITY): Payer: Self-pay

## 2022-11-24 ENCOUNTER — Other Ambulatory Visit: Payer: Self-pay | Admitting: Family Medicine

## 2022-11-24 DIAGNOSIS — E669 Obesity, unspecified: Secondary | ICD-10-CM

## 2022-11-24 DIAGNOSIS — I10 Essential (primary) hypertension: Secondary | ICD-10-CM

## 2022-11-25 NOTE — Telephone Encounter (Signed)
Requested medications are due for refill today.  yes  Requested medications are on the active medications list.  yes  Last refill. 08/27/2022 #90 0 rf  Future visit scheduled.   yes  Notes to clinic.  Computer will not allow me to refill d/t a deleted diagnosis. Please review    Requested Prescriptions  Pending Prescriptions Disp Refills   hydrochlorothiazide (HYDRODIURIL) 25 MG tablet 90 tablet 0    Sig: Take 1 tablet (25 mg total) by mouth daily.     Cardiovascular: Diuretics - Thiazide Failed - 11/24/2022  9:21 AM      Failed - Cr in normal range and within 180 days    Creatinine  Date Value Ref Range Status  12/14/2011 0.82 0.60 - 1.30 mg/dL Final   Creatinine, Ser  Date Value Ref Range Status  04/01/2022 0.92 0.57 - 1.00 mg/dL Final         Failed - K in normal range and within 180 days    Potassium  Date Value Ref Range Status  04/01/2022 4.1 3.5 - 5.2 mmol/L Final  12/09/2011 4.2 3.5 - 5.1 mmol/L Final         Failed - Na in normal range and within 180 days    Sodium  Date Value Ref Range Status  04/01/2022 144 134 - 144 mmol/L Final  12/09/2011 141 136 - 145 mmol/L Final         Failed - Valid encounter within last 6 months    Recent Outpatient Visits           7 months ago Prediabetes   [facility] Alfredia Ferguson, PA-C   9 months ago Morbid obesity [facility])   [place] [facility] Alfredia Ferguson, PA-C   11 months ago Acute bronchitis, unspecified organism   [place] [facility] Ronnald Ramp, MD   1 year ago Annual physical exam   [place] [facility] Alfredia Ferguson, PA-C   1 year ago Essential hypertension   [place] [facility] Bosie Clos, MD       Future Appointments             In 1 week Simmons-Robinson, Tawanna Cooler, MD [facility]            Passed - Last BP in normal range    BP Readings  from Last 1 Encounters:  04/01/22 125/88

## 2022-11-25 NOTE — Telephone Encounter (Signed)
Called pt and scheduled OV

## 2022-11-30 ENCOUNTER — Other Ambulatory Visit (HOSPITAL_COMMUNITY): Payer: Self-pay

## 2022-12-07 NOTE — Progress Notes (Unsigned)
      Established patient visit   Patient: Leah Burnett   DOB: 03/26/1970   52 y.o. Female  MRN: [id number] Visit Date: 12/08/2022  Today's healthcare provider: Ronnald Ramp, MD   No chief complaint on file.  Subjective       Discussed the use of AI scribe software for clinical note transcription with the patient, who gave verbal consent to proceed.  History of Present Illness             Past Medical History:  Diagnosis Date   Duodenitis    Endometriosis 01/05/2000   External thrombosed hemorrhoids 01/05/2007   GERD (gastroesophageal reflux disease)    Hypertension     Medications: Outpatient Medications Prior to Visit  Medication Sig   albuterol (VENTOLIN HFA) 108 (90 Base) MCG/ACT inhaler 2 puffs Inhalation q 4-6 hours prn cough/wheeze 90 days   albuterol (VENTOLIN HFA) 108 (90 Base) MCG/ACT inhaler Inhale 2 puffs into the lungs every 4 (four) hours as needed for cough/wheeze   Azelastine-Fluticasone 137-50 MCG/ACT SUSP Place 1 spray into both nostrils 2 (two) times daily.   Azelastine-Fluticasone 137-50 MCG/ACT SUSP Place 1 spray into both nostrils 2 (two) times daily.   hydrochlorothiazide (HYDRODIURIL) 25 MG tablet Take 1 tablet (25 mg total) by mouth daily.   metFORMIN (GLUCOPHAGE-XR) 500 MG 24 hr tablet Take 1 tablet (500 mg total) by mouth daily with breakfast for 7 days, THEN 2 tablets (1,000 mg total) daily with breakfast.   pantoprazole (PROTONIX) 40 MG tablet Take 1 tablet (40 mg total) by mouth daily.   pantoprazole (PROTONIX) 40 MG tablet Take 1 tablet (40 mg total) by mouth daily.   phentermine (ADIPEX-P) 37.5 MG tablet Take 1 tablet (37.5 mg total) by mouth daily before breakfast.   valsartan (DIOVAN) 80 MG tablet Take 1 tablet (80 mg total) by mouth daily.   No facility-administered medications prior to visit.    Review of Systems  {Insert previous labs (optional):23779} {See past labs  Heme  Chem  Endocrine  Serology   Results Review (optional):1}   Objective    There were no vitals taken for this visit. {Insert last BP/Wt (optional):23777}{See vitals history (optional):1}    Physical Exam  ***  No results found for any visits on 12/08/22.  Assessment & Plan     Problem List Items Addressed This Visit   None   Assessment and Plan              No follow-ups on file.         Ronnald Ramp, MD  [facility] [phone] (phone) [phone] (fax)  [facility]

## 2022-12-08 ENCOUNTER — Ambulatory Visit: Payer: BC Managed Care – PPO | Admitting: Family Medicine

## 2022-12-08 ENCOUNTER — Encounter: Payer: Self-pay | Admitting: Family Medicine

## 2022-12-08 VITALS — BP 129/97 | HR 86 | Resp 18 | Ht 64.0 in | Wt 171.0 lb

## 2022-12-08 DIAGNOSIS — E782 Mixed hyperlipidemia: Secondary | ICD-10-CM

## 2022-12-08 DIAGNOSIS — R7303 Prediabetes: Secondary | ICD-10-CM | POA: Diagnosis not present

## 2022-12-08 DIAGNOSIS — I10 Essential (primary) hypertension: Secondary | ICD-10-CM | POA: Diagnosis not present

## 2022-12-08 DIAGNOSIS — R5383 Other fatigue: Secondary | ICD-10-CM

## 2022-12-08 NOTE — Assessment & Plan Note (Signed)
Elevated cholesterol levels with family history of cardiovascular disease. Discussed importance of diet and exercise. Recommended Mediterranean diet. Explained potential need for statin therapy or medications like Repatha if lifestyle changes are insufficient. Informed about ASCVD risk score implications. - Order lipid panel - Discuss Mediterranean diet - Consider statin therapy if cholesterol levels remain elevated

## 2022-12-08 NOTE — Assessment & Plan Note (Signed)
Previous A1c was 5.7%. Currently on metformin 1000 mg daily. Concerns about blood sugar levels and metformin effectiveness. Discussed importance of monitoring A1c and potential metformin adjustment based on results. - Order A1c test - Continue metformin 1000 mg daily - Discuss potential adjustment of metformin based on A1c results

## 2022-12-08 NOTE — Assessment & Plan Note (Addendum)
Blood pressure is elevated at 129/97 mmHg. Currently on hydrochlorothiazide 25 mg daily. Stress and irregular work hours may contribute. Discussed potential impact of phentermine on blood pressure and heart rate, and the need for close monitoring if resuming phentermine. - chronic not currently at goal  - Recheck blood pressure - Continue hydrochlorothiazide 25 mg daily - Monitor blood pressure closely if resuming phentermine - CMP ordered

## 2022-12-08 NOTE — Patient Instructions (Signed)
Mediterranean Diet A Mediterranean diet is based on the traditions of countries on the [employer]. It focuses on eating more: Fruits and vegetables. Whole grains, beans, nuts, and seeds. Heart-healthy fats. These are fats that are good for your heart. It involves eating less: Dairy. Meat and eggs. Processed foods with added sugar, salt, and fat. This type of diet can help prevent certain conditions. It can also improve outcomes if you have a long-term (chronic) disease, such as kidney or heart disease. What are tips for following this plan? Reading food labels Check packaged foods for: The serving size. For foods such as rice and pasta, the serving size is the amount of cooked product, not dry. The total fat. Avoid foods with saturated fat or trans fat. Added sugars, such as corn syrup. Shopping  Try to have a balanced diet. Buy a variety of foods, such as: Fresh fruits and vegetables. You may be able to get these from local farmers markets. You can also buy them frozen. Grains, beans, nuts, and seeds. Some of these can be bought in bulk. Fresh seafood. Poultry and eggs. Low-fat dairy products. Buy whole ingredients instead of foods that have already been packaged. If you can't get fresh seafood, buy precooked frozen shrimp or canned fish, such as tuna, salmon, or sardines. Stock your pantry so you always have certain foods on hand, such as olive oil, canned tuna, canned tomatoes, rice, pasta, and beans. Cooking Cook foods with extra-virgin olive oil instead of using butter or other vegetable oils. Have meat as a side dish. Have vegetables or grains as your main dish. This means having meat in small portions or adding small amounts of meat to foods like pasta or stew. Use beans or vegetables instead of meat in common dishes like chili or lasagna. Try out different cooking methods. Try roasting, broiling, steaming, and sauting vegetables. Add frozen vegetables to soups, stews,  pasta, or rice. Add nuts or seeds for added healthy fats and plant protein at each meal. You can add these to yogurt, salads, or vegetable dishes. Marinate fish or vegetables using olive oil, lemon juice, garlic, and fresh herbs. Meal planning Plan to eat a vegetarian meal one day each week. Try to work up to two vegetarian meals, if possible. Eat seafood two or more times a week. Have healthy snacks on hand. These may include: Vegetable sticks with hummus. Greek yogurt. Fruit and nut trail mix. Eat balanced meals. These should include: Fruit: 2-3 servings a day. Vegetables: 4-5 servings a day. Low-fat dairy: 2 servings a day. Fish, poultry, or lean meat: 1 serving a day. Beans and legumes: 2 or more servings a week. Nuts and seeds: 1-2 servings a day. Whole grains: 6-8 servings a day. Extra-virgin olive oil: 3-4 servings a day. Limit red meat and sweets to just a few servings a month. Lifestyle  Try to cook and eat meals with your family. Drink enough fluid to keep your pee (urine) pale yellow. Be active every day. This includes: Aerobic exercise, which is exercise that causes your heart to beat faster. Examples include running and swimming. Leisure activities like gardening, walking, or housework. Get 7-8 hours of sleep each night. Drink red wine if your provider says you can. A glass of wine is 5 oz (150 mL). You may be allowed to have: Up to 1 glass a day if you're female and not pregnant. Up to 2 glasses a day if you're female. What foods should I eat? Fruits Apples. Apricots. Avocado.  Berries. Bananas. Cherries. Dates. Figs. Grapes. Lemons. Melon. Oranges. Peaches. Plums. Pomegranate. Vegetables Artichokes. Beets. Broccoli. Cabbage. Carrots. Eggplant. Green beans. Chard. Kale. Spinach. Onions. Leeks. Peas. Squash. Tomatoes. Peppers. Radishes. Grains Whole-grain pasta. Brown rice. Bulgur wheat. Polenta. Couscous. Whole-wheat bread. Orpah Cobb. Meats and other  proteins Beans. Almonds. Sunflower seeds. Pine nuts. Peanuts. Cod. Salmon. Scallops. Shrimp. Tuna. Tilapia. Clams. Oysters. Eggs. Chicken or [place] without skin. Dairy Low-fat milk. Cheese. Greek yogurt. Fats and oils Extra-virgin olive oil. Avocado oil. Grapeseed oil. Beverages Water. Red wine. Herbal tea. Sweets and desserts Greek yogurt with honey. Baked apples. Poached pears. Trail mix. Seasonings and condiments Basil. Cilantro. Coriander. Cumin. Mint. Parsley. Sage. Rosemary. Tarragon. Garlic. Oregano. Thyme. Pepper. Balsamic vinegar. Tahini. Hummus. Tomato sauce. Olives. Mushrooms. The items listed above may not be all the foods and drinks you can have. Talk to a dietitian to learn more. What foods should I limit? This is a list of foods that should be eaten rarely. Fruits Fruit canned in syrup. Vegetables Deep-fried potatoes, like [place] fries. Grains Packaged pasta or rice dishes. Cereal with added sugar. Snacks with added sugar. Meats and other proteins Beef. Pork. Lamb. Chicken or [place] with skin. Hot dogs. Tomasa Blase. Dairy Ice cream. Sour cream. Whole milk. Fats and oils Butter. Canola oil. Vegetable oil. Beef fat (tallow). Lard. Beverages Juice. Sugar-sweetened soft drinks. Beer. Liquor and spirits. Sweets and desserts Cookies. Cakes. Pies. Candy. Seasonings and condiments Mayonnaise. Pre-made sauces and marinades. The items listed above may not be all the foods and drinks you should limit. Talk to a dietitian to learn more. Where to find more information American Heart Association (AHA): heart.org This information is not intended to replace advice given to you by your health care provider. Make sure you discuss any questions you have with your health care provider. Document Revised: 04/04/2022 Document Reviewed: 04/04/2022 Elsevier Patient Education  2024 [employer].

## 2022-12-09 LAB — CBC
Hematocrit: 42 % (ref 34.0–46.6)
Hemoglobin: 14 g/dL (ref 11.1–15.9)
MCH: 28.7 pg (ref 26.6–33.0)
MCHC: 33.3 g/dL (ref 31.5–35.7)
MCV: 86 fL (ref 79–97)
Platelets: 417 10*3/uL (ref 150–450)
RBC: 4.87 x10E6/uL (ref 3.77–5.28)
RDW: 13.5 % (ref 11.7–15.4)
WBC: 6.7 10*3/uL (ref 3.4–10.8)

## 2022-12-09 LAB — CMP14+EGFR
ALT: 20 [IU]/L (ref 0–32)
AST: 18 [IU]/L (ref 0–40)
Albumin: 4.7 g/dL (ref 3.8–4.9)
Alkaline Phosphatase: 130 [IU]/L — ABNORMAL HIGH (ref 44–121)
BUN/Creatinine Ratio: 20 (ref 9–23)
BUN: 19 mg/dL (ref 6–24)
Bilirubin Total: 0.4 mg/dL (ref 0.0–1.2)
CO2: 23 mmol/L (ref 20–29)
Calcium: 9.9 mg/dL (ref 8.7–10.2)
Chloride: 104 mmol/L (ref 96–106)
Creatinine, Ser: 0.95 mg/dL (ref 0.57–1.00)
Globulin, Total: 2.7 g/dL (ref 1.5–4.5)
Glucose: 100 mg/dL — ABNORMAL HIGH (ref 70–99)
Potassium: 3.7 mmol/L (ref 3.5–5.2)
Sodium: 143 mmol/L (ref 134–144)
Total Protein: 7.4 g/dL (ref 6.0–8.5)
eGFR: 71 mL/min/{1.73_m2} (ref >59)

## 2022-12-09 LAB — LIPID PANEL
Chol/HDL Ratio: 5.4 {ratio} — ABNORMAL HIGH (ref 0.0–4.4)
Cholesterol, Total: 253 mg/dL — ABNORMAL HIGH (ref 100–199)
HDL: 47 mg/dL (ref >39)
LDL Chol Calc (NIH): 185 mg/dL — ABNORMAL HIGH (ref 0–99)
Triglycerides: 117 mg/dL (ref 0–149)
VLDL Cholesterol Cal: 21 mg/dL (ref 5–40)

## 2022-12-09 LAB — HEMOGLOBIN A1C
Est. average glucose Bld gHb Est-mCnc: 117 mg/dL
Hgb A1c MFr Bld: 5.7 % — ABNORMAL HIGH (ref 4.8–5.6)

## 2022-12-09 LAB — VITAMIN B12: Vitamin B-12: 641 pg/mL (ref 232–1245)

## 2023-02-02 ENCOUNTER — Other Ambulatory Visit: Payer: Self-pay | Admitting: Family Medicine

## 2023-02-02 ENCOUNTER — Other Ambulatory Visit (HOSPITAL_COMMUNITY): Payer: Self-pay

## 2023-02-02 DIAGNOSIS — I10 Essential (primary) hypertension: Secondary | ICD-10-CM

## 2023-02-02 DIAGNOSIS — E669 Obesity, unspecified: Secondary | ICD-10-CM

## 2023-02-03 ENCOUNTER — Encounter: Payer: Self-pay | Admitting: Family Medicine

## 2023-02-03 ENCOUNTER — Other Ambulatory Visit: Payer: Self-pay

## 2023-02-03 MED ORDER — HYDROCHLOROTHIAZIDE 25 MG PO TABS
25.0000 mg | ORAL_TABLET | Freq: Every day | ORAL | 0 refills | Status: DC
  Filled 2023-02-03: qty 90, 90d supply, fill #0

## 2023-03-10 ENCOUNTER — Encounter: Payer: Self-pay | Admitting: Family Medicine

## 2023-03-10 NOTE — Progress Notes (Deleted)
 Complete physical exam   Patient: Leah Burnett   DOB: 11/27/1970   53 y.o. Female  MRN: [id number] Visit Date: 03/10/2023  Today's healthcare provider: Ronnald Ramp, MD   No chief complaint on file.  Subjective    Leah Burnett is a 53 y.o. female who presents today for a complete physical exam.   She reports consuming a {diet types:17450} diet.   {Exercise:19826}    She {does/does not:200015} have additional problems to discuss today.   Discussed the use of AI scribe software for clinical note transcription with the patient, who gave verbal consent to proceed.  History of Present Illness             Past Medical History:  Diagnosis Date   Duodenitis    Endometriosis 01/05/2000   External thrombosed hemorrhoids 01/05/2007   GERD (gastroesophageal reflux disease)    Hypertension    Past Surgical History:  Procedure Laterality Date   ABDOMINAL HYSTERECTOMY     CHOLECYSTECTOMY  2009   COLONOSCOPY WITH PROPOFOL N/A 11/12/2021   Procedure: COLONOSCOPY WITH PROPOFOL;  Surgeon: Midge Minium, MD;  Location: [facility];  Service: Endoscopy;  Laterality: N/A;  Potassium check day of surgery   ovary     right   OVARY SURGERY  2002   Social History   Socioeconomic History   Marital status: Married    Spouse name: Not on file   Number of children: Not on file   Years of education: Not on file   Highest education level: Not on file  Occupational History   Not on file  Tobacco Use   Smoking status: Never   Smokeless tobacco: Never  Vaping Use   Vaping status: Never Used  Substance and Sexual Activity   Alcohol use: Never    Alcohol/week: 0.0 standard drinks of alcohol    Comment: very rarely   Drug use: No   Sexual activity: Not on file  Other Topics Concern   Not on file  Social History Narrative   Not on file   Social Drivers of Health   Financial Resource Strain: Not on file  Food Insecurity: Not on file   Transportation Needs: Not on file  Physical Activity: Not on file  Stress: Not on file  Social Connections: Not on file  Intimate Partner Violence: Not on file   Family Status  Relation Name Status   Mother  Alive   Father  Alive   Brother  Alive   Brother  Alive   PGM  (Not Specified)   PGF  (Not Specified)   Neg Hx  (Not Specified)  No partnership data on file   Family History  Problem Relation Age of Onset   Hyperlipidemia Mother    Anemia Mother    Cirrhosis Mother    Hypertension Father    Heart disease Father        MI with bypass   Hyperlipidemia Father    Stroke Father    Prostate cancer Brother    Stroke Paternal Grandmother    Stroke Paternal Grandfather    Breast cancer Neg Hx    No Known Allergies   Medications: Outpatient Medications Prior to Visit  Medication Sig   albuterol (VENTOLIN HFA) 108 (90 Base) MCG/ACT inhaler 2 puffs Inhalation q 4-6 hours prn cough/wheeze 90 days   Azelastine-Fluticasone 137-50 MCG/ACT SUSP Place 1 spray into both nostrils 2 (two) times daily.   hydrochlorothiazide (HYDRODIURIL) 25 MG tablet Take 1  tablet (25 mg total) by mouth daily.   metFORMIN (GLUCOPHAGE-XR) 500 MG 24 hr tablet Take 1 tablet (500 mg total) by mouth daily with breakfast for 7 days, THEN 2 tablets (1,000 mg total) daily with breakfast.   pantoprazole (PROTONIX) 40 MG tablet Take 1 tablet (40 mg total) by mouth daily.   phentermine (ADIPEX-P) 37.5 MG tablet Take 1 tablet (37.5 mg total) by mouth daily before breakfast.   No facility-administered medications prior to visit.    Review of Systems  Last CBC Lab Results  Component Value Date   WBC 6.7 12/08/2022   HGB 14.0 12/08/2022   HCT 42.0 12/08/2022   MCV 86 12/08/2022   MCH 28.7 12/08/2022   RDW 13.5 12/08/2022   PLT 417 12/08/2022   Last metabolic panel Lab Results  Component Value Date   GLUCOSE 100 (H) 12/08/2022   NA 143 12/08/2022   K 3.7 12/08/2022   CL 104 12/08/2022   CO2 23  12/08/2022   BUN 19 12/08/2022   CREATININE 0.95 12/08/2022   EGFR 71 12/08/2022   CALCIUM 9.9 12/08/2022   PROT 7.4 12/08/2022   ALBUMIN 4.7 12/08/2022   LABGLOB 2.7 12/08/2022   AGRATIO 1.7 04/01/2022   BILITOT 0.4 12/08/2022   ALKPHOS 130 (H) 12/08/2022   AST 18 12/08/2022   ALT 20 12/08/2022   ANIONGAP 6 (L) 12/09/2011   Last lipids Lab Results  Component Value Date   CHOL 253 (H) 12/08/2022   HDL 47 12/08/2022   LDLCALC 185 (H) 12/08/2022   TRIG 117 12/08/2022   CHOLHDL 5.4 (H) 12/08/2022   Last hemoglobin A1c Lab Results  Component Value Date   HGBA1C 5.7 (H) 12/08/2022   Last thyroid functions Lab Results  Component Value Date   TSH 1.480 08/07/2021   Last vitamin D No results found for: "25OHVITD2", "25OHVITD3", "VD25OH" Last vitamin B12 and Folate Lab Results  Component Value Date   VITAMINB12 641 12/08/2022     {See past labs  Heme  Chem  Endocrine  Serology  Results Review (optional):1}  Objective    There were no vitals taken for this visit. BP Readings from Last 3 Encounters:  12/08/22 (!) 129/97  04/01/22 125/88  02/18/22 122/86   Wt Readings from Last 3 Encounters:  12/08/22 171 lb (77.6 kg)  04/01/22 167 lb 14.4 oz (76.2 kg)  02/18/22 170 lb (77.1 kg)    {See vitals history (optional):1}    Physical Exam  ***  Last depression screening scores    12/08/2022   10:52 AM 04/01/2022    2:59 PM 02/18/2022   10:34 AM  PHQ 2/9 Scores  PHQ - 2 Score 0 0 0  PHQ- 9 Score 1 1 1     Last fall risk screening    12/08/2022   10:53 AM  Fall Risk   Falls in the past year? 0  Number falls in past yr: 0  Injury with Fall? 0    Last Audit-C alcohol use screening    04/01/2022    2:59 PM  Alcohol Use Disorder Test (AUDIT)  1. How often do you have a drink containing alcohol? 1  3. How often do you have six or more drinks on one occasion? 0   A score of 3 or more in women, and 4 or more in men indicates increased risk for alcohol  abuse, EXCEPT if all of the points are from question 1   No results found for any visits on 03/10/23.  Assessment & Plan    Routine Health Maintenance and Physical Exam  Immunization History  Administered Date(s) Administered   H1N1 09/30/2009   Hepatitis B, ADULT 09/20/1994, 10/18/1994, 04/18/1995   Influenza, High Dose Seasonal PF 01/18/2020   Influenza,inj,Quad PF,6+ Mos 10/14/2021   Influenza,inj,quad, With Preservative 09/23/2019   Influenza-Unspecified 10/05/2014, 09/19/2018, 10/07/2020   MMR 08/22/1971   PFIZER(Purple Top)SARS-COV-2 Vaccination 01/01/2019, 01/22/2019, 06/11/2019, 01/18/2020   Tdap 06/28/2019    Health Maintenance  Topic Date Due   HIV Screening  Never done   Hepatitis C Screening  Never done   Zoster Vaccines- Shingrix (1 of 2) Never done   COVID-19 Vaccine (5 - 2024-25 season) 09/05/2022   MAMMOGRAM  01/08/2023   INFLUENZA VACCINE  04/04/2023 (Originally 08/05/2022)   DTaP/Tdap/Td (2 - Td or Tdap) 06/27/2029   Colonoscopy  11/13/2031   HPV VACCINES  Aged Out    Problem List Items Addressed This Visit       Cardiovascular and Mediastinum   SVT (supraventricular tachycardia) (HCC)   Hypertension   Hot flash, menopausal     Other   Elevated LFTs   Anxiety   Other Visit Diagnoses       Annual physical exam    -  Primary     Encounter for screening mammogram for malignant neoplasm of breast         Screening for HIV (human immunodeficiency virus)         Screening for lipid disorders         Encounter for hepatitis C screening test for low risk patient           Assessment and Plan                No follow-ups on file.       Ronnald Ramp, MD  [facility] [phone] (phone) [phone] (fax)  [facility]

## 2023-06-27 ENCOUNTER — Other Ambulatory Visit: Payer: Self-pay | Admitting: Family Medicine

## 2023-06-27 DIAGNOSIS — E669 Obesity, unspecified: Secondary | ICD-10-CM

## 2023-06-27 DIAGNOSIS — I10 Essential (primary) hypertension: Secondary | ICD-10-CM

## 2023-06-27 MED ORDER — HYDROCHLOROTHIAZIDE 25 MG PO TABS
25.0000 mg | ORAL_TABLET | Freq: Every day | ORAL | 0 refills | Status: AC
  Filled 2023-06-27: qty 90, 90d supply, fill #0

## 2023-06-28 ENCOUNTER — Other Ambulatory Visit: Payer: Self-pay

## 2023-10-07 ENCOUNTER — Encounter: Payer: Self-pay | Admitting: Pharmacist

## 2023-10-07 ENCOUNTER — Other Ambulatory Visit (HOSPITAL_COMMUNITY): Payer: Self-pay

## 2023-10-07 ENCOUNTER — Other Ambulatory Visit: Payer: Self-pay

## 2023-10-07 MED ORDER — XIIDRA 5 % OP SOLN
1.0000 [drp] | Freq: Two times a day (BID) | OPHTHALMIC | 3 refills | Status: AC
  Filled 2023-10-07: qty 180, 90d supply, fill #0

## 2024-01-17 ENCOUNTER — Other Ambulatory Visit: Payer: Self-pay | Admitting: Family Medicine

## 2024-01-17 ENCOUNTER — Other Ambulatory Visit (HOSPITAL_COMMUNITY): Payer: Self-pay

## 2024-01-17 DIAGNOSIS — E669 Obesity, unspecified: Secondary | ICD-10-CM

## 2024-01-17 DIAGNOSIS — I10 Essential (primary) hypertension: Secondary | ICD-10-CM
